# Patient Record
Sex: Male | Born: 1965 | Race: Asian | Hispanic: No | Marital: Married | Smoking: Never smoker
Health system: Southern US, Community
[De-identification: ages and names within clinical notes are randomized; demographics above are authoritative.]

## PROBLEM LIST (undated history)

## (undated) DIAGNOSIS — I639 Cerebral infarction, unspecified: Secondary | ICD-10-CM

## (undated) HISTORY — DX: Cerebral infarction, unspecified: I63.9

---

## 2017-06-27 ENCOUNTER — Emergency Department (HOSPITAL_COMMUNITY): Payer: BLUE CROSS/BLUE SHIELD

## 2017-06-27 ENCOUNTER — Inpatient Hospital Stay (HOSPITAL_COMMUNITY)
Admission: EM | Admit: 2017-06-27 | Discharge: 2017-07-05 | DRG: 065 | Disposition: A | Discharge status: Skilled Nursing Facility | Payer: BLUE CROSS/BLUE SHIELD | Attending: Internal Medicine | Admitting: Internal Medicine

## 2017-06-27 ENCOUNTER — Encounter (HOSPITAL_COMMUNITY): Payer: Self-pay | Admitting: Emergency Medicine

## 2017-06-27 ENCOUNTER — Observation Stay (HOSPITAL_COMMUNITY): Payer: BLUE CROSS/BLUE SHIELD

## 2017-06-27 DIAGNOSIS — R739 Hyperglycemia, unspecified: Secondary | ICD-10-CM

## 2017-06-27 DIAGNOSIS — M25511 Pain in right shoulder: Secondary | ICD-10-CM | POA: Diagnosis present

## 2017-06-27 DIAGNOSIS — E7439 Other disorders of intestinal carbohydrate absorption: Secondary | ICD-10-CM

## 2017-06-27 DIAGNOSIS — I10 Essential (primary) hypertension: Secondary | ICD-10-CM | POA: Diagnosis not present

## 2017-06-27 DIAGNOSIS — R402254 Coma scale, best verbal response, oriented, 24 hours or more after hospital admission: Secondary | ICD-10-CM | POA: Diagnosis present

## 2017-06-27 DIAGNOSIS — E876 Hypokalemia: Secondary | ICD-10-CM

## 2017-06-27 DIAGNOSIS — R471 Dysarthria and anarthria: Secondary | ICD-10-CM | POA: Diagnosis present

## 2017-06-27 DIAGNOSIS — R718 Other abnormality of red blood cells: Secondary | ICD-10-CM | POA: Diagnosis present

## 2017-06-27 DIAGNOSIS — E871 Hypo-osmolality and hyponatremia: Secondary | ICD-10-CM | POA: Diagnosis not present

## 2017-06-27 DIAGNOSIS — R531 Weakness: Secondary | ICD-10-CM | POA: Diagnosis not present

## 2017-06-27 DIAGNOSIS — E785 Hyperlipidemia, unspecified: Secondary | ICD-10-CM | POA: Diagnosis present

## 2017-06-27 DIAGNOSIS — Z23 Encounter for immunization: Secondary | ICD-10-CM

## 2017-06-27 DIAGNOSIS — I639 Cerebral infarction, unspecified: Secondary | ICD-10-CM | POA: Diagnosis not present

## 2017-06-27 DIAGNOSIS — R29709 NIHSS score 9: Secondary | ICD-10-CM | POA: Diagnosis present

## 2017-06-27 DIAGNOSIS — M62521 Muscle wasting and atrophy, not elsewhere classified, right upper arm: Secondary | ICD-10-CM | POA: Diagnosis present

## 2017-06-27 DIAGNOSIS — I6389 Other cerebral infarction: Secondary | ICD-10-CM | POA: Diagnosis not present

## 2017-06-27 DIAGNOSIS — M6258 Muscle wasting and atrophy, not elsewhere classified, other site: Secondary | ICD-10-CM | POA: Diagnosis present

## 2017-06-27 DIAGNOSIS — R1310 Dysphagia, unspecified: Secondary | ICD-10-CM | POA: Diagnosis present

## 2017-06-27 DIAGNOSIS — R402364 Coma scale, best motor response, obeys commands, 24 hours or more after hospital admission: Secondary | ICD-10-CM | POA: Diagnosis present

## 2017-06-27 DIAGNOSIS — R29810 Facial weakness: Secondary | ICD-10-CM | POA: Diagnosis present

## 2017-06-27 DIAGNOSIS — I6789 Other cerebrovascular disease: Secondary | ICD-10-CM | POA: Diagnosis present

## 2017-06-27 DIAGNOSIS — R29717 NIHSS score 17: Secondary | ICD-10-CM | POA: Diagnosis not present

## 2017-06-27 DIAGNOSIS — R402134 Coma scale, eyes open, to sound, 24 hours or more after hospital admission: Secondary | ICD-10-CM | POA: Diagnosis present

## 2017-06-27 DIAGNOSIS — R0989 Other specified symptoms and signs involving the circulatory and respiratory systems: Secondary | ICD-10-CM | POA: Diagnosis not present

## 2017-06-27 DIAGNOSIS — G8194 Hemiplegia, unspecified affecting left nondominant side: Secondary | ICD-10-CM | POA: Diagnosis present

## 2017-06-27 LAB — CBC
HEMATOCRIT: 41.1 % (ref 39.0–52.0)
HEMOGLOBIN: 13.3 g/dL (ref 13.0–17.0)
MCH: 21.7 pg — ABNORMAL LOW (ref 26.0–34.0)
MCHC: 32.4 g/dL (ref 30.0–36.0)
MCV: 67 fL — ABNORMAL LOW (ref 78.0–100.0)
Platelets: 197 10*3/uL (ref 150–400)
RBC: 6.13 MIL/uL — ABNORMAL HIGH (ref 4.22–5.81)
RDW: 15.4 % (ref 11.5–15.5)
WBC: 8.8 10*3/uL (ref 4.0–10.5)

## 2017-06-27 LAB — COMPREHENSIVE METABOLIC PANEL
ALBUMIN: 4.1 g/dL (ref 3.5–5.0)
ALT: 15 U/L — ABNORMAL LOW (ref 17–63)
ANION GAP: 10 (ref 5–15)
AST: 36 U/L (ref 15–41)
Alkaline Phosphatase: 110 U/L (ref 38–126)
BUN: 12 mg/dL (ref 6–20)
CHLORIDE: 99 mmol/L — AB (ref 101–111)
CO2: 22 mmol/L (ref 22–32)
Calcium: 8.9 mg/dL (ref 8.9–10.3)
Creatinine, Ser: 0.89 mg/dL (ref 0.61–1.24)
GFR calc Af Amer: >60 mL/min (ref >60)
GFR calc non Af Amer: >60 mL/min (ref >60)
GLUCOSE: 143 mg/dL — AB (ref 65–99)
POTASSIUM: 3.4 mmol/L — AB (ref 3.5–5.1)
SODIUM: 131 mmol/L — AB (ref 135–145)
Total Bilirubin: 0.6 mg/dL (ref 0.3–1.2)
Total Protein: 7.6 g/dL (ref 6.5–8.1)

## 2017-06-27 LAB — I-STAT CHEM 8, ED
BUN: 15 mg/dL (ref 6–20)
CALCIUM ION: 1.08 mmol/L — AB (ref 1.15–1.40)
CHLORIDE: 100 mmol/L — AB (ref 101–111)
Creatinine, Ser: 0.8 mg/dL (ref 0.61–1.24)
Glucose, Bld: 141 mg/dL — ABNORMAL HIGH (ref 65–99)
HEMATOCRIT: 44 % (ref 39.0–52.0)
Hemoglobin: 15 g/dL (ref 13.0–17.0)
POTASSIUM: 3.7 mmol/L (ref 3.5–5.1)
SODIUM: 138 mmol/L (ref 135–145)
TCO2: 27 mmol/L (ref 22–32)

## 2017-06-27 LAB — DIFFERENTIAL
BASOS ABS: 0.1 10*3/uL (ref 0.0–0.1)
Basophils Relative: 1 %
EOS PCT: 2 %
Eosinophils Absolute: 0.2 10*3/uL (ref 0.0–0.7)
LYMPHS ABS: 2.9 10*3/uL (ref 0.7–4.0)
Lymphocytes Relative: 33 %
MONO ABS: 0.4 10*3/uL (ref 0.1–1.0)
Monocytes Relative: 4 %
NEUTROS PCT: 60 %
Neutro Abs: 5.2 10*3/uL (ref 1.7–7.7)

## 2017-06-27 LAB — I-STAT TROPONIN, ED: Troponin i, poc: 0 ng/mL (ref 0.00–0.08)

## 2017-06-27 LAB — APTT: APTT: 26 s (ref 24–36)

## 2017-06-27 LAB — PROTIME-INR
INR: 1.01
Prothrombin Time: 13.2 seconds (ref 11.4–15.2)

## 2017-06-27 LAB — CBG MONITORING, ED: GLUCOSE-CAPILLARY: 133 mg/dL — AB (ref 65–99)

## 2017-06-27 MED ORDER — STROKE: EARLY STAGES OF RECOVERY BOOK
Freq: Once | Status: AC
  Administered 2017-06-27: 22:00:00
  Filled 2017-06-27: qty 1

## 2017-06-27 MED ORDER — ACETAMINOPHEN 160 MG/5ML PO SOLN: 650.0000 mg | ORAL | Status: DC | PRN

## 2017-06-27 MED ORDER — ONDANSETRON HCL 4 MG/2ML IJ SOLN
INTRAMUSCULAR | Status: AC
  Filled 2017-06-27: qty 2

## 2017-06-27 MED ORDER — ACETAMINOPHEN 325 MG PO TABS
650.0000 mg | ORAL_TABLET | ORAL | Status: DC | PRN
  Administered 2017-06-29 – 2017-07-04 (×6): 650 mg via ORAL
  Filled 2017-06-27 (×6): qty 2

## 2017-06-27 MED ORDER — ASPIRIN 300 MG RE SUPP: 300.0000 mg | Freq: Once | RECTAL | Status: DC

## 2017-06-27 MED ORDER — ENOXAPARIN SODIUM 40 MG/0.4ML ~~LOC~~ SOLN
40.0000 mg | SUBCUTANEOUS | Status: DC
  Administered 2017-06-27 – 2017-07-04 (×8): 40 mg via SUBCUTANEOUS
  Filled 2017-06-27 (×8): qty 0.4

## 2017-06-27 MED ORDER — ASPIRIN 300 MG RE SUPP
300.0000 mg | Freq: Every day | RECTAL | Status: DC
  Filled 2017-06-27: qty 1

## 2017-06-27 MED ORDER — HYDRALAZINE HCL 20 MG/ML IJ SOLN
10.0000 mg | Freq: Four times a day (QID) | INTRAMUSCULAR | Status: DC | PRN
  Administered 2017-06-28 – 2017-07-01 (×4): 10 mg via INTRAVENOUS
  Filled 2017-06-27 (×4): qty 1

## 2017-06-27 MED ORDER — SENNOSIDES-DOCUSATE SODIUM 8.6-50 MG PO TABS
1.0000 | ORAL_TABLET | Freq: Every evening | ORAL | Status: DC | PRN
  Administered 2017-07-01: 1 via ORAL
  Filled 2017-06-27: qty 1

## 2017-06-27 MED ORDER — CLOPIDOGREL BISULFATE 75 MG PO TABS
75.0000 mg | ORAL_TABLET | Freq: Every day | ORAL | Status: DC
  Administered 2017-06-28 – 2017-07-05 (×8): 75 mg via ORAL
  Filled 2017-06-27 (×8): qty 1

## 2017-06-27 MED ORDER — ASPIRIN 325 MG PO TABS
325.0000 mg | ORAL_TABLET | Freq: Every day | ORAL | Status: DC
  Administered 2017-06-28 – 2017-07-05 (×8): 325 mg via ORAL
  Filled 2017-06-27 (×8): qty 1

## 2017-06-27 MED ORDER — ACETAMINOPHEN 650 MG RE SUPP: 650.0000 mg | RECTAL | Status: DC | PRN

## 2017-06-27 MED ORDER — IOPAMIDOL (ISOVUE-370) INJECTION 76%
INTRAVENOUS | Status: AC
  Administered 2017-06-27: 90 mL
  Filled 2017-06-27: qty 100

## 2017-06-27 NOTE — ED Notes (Signed)
Report called , Pt will go to MRI then transport to 3W

## 2017-06-27 NOTE — ED Notes (Signed)
Patient transported to MRI 

## 2017-06-27 NOTE — ED Provider Notes (Signed)
Emergency Department Provider Note   I have reviewed the triage vital signs and the nursing notes.  Family member at bedside for translation  HISTORY  Chief Complaint Cerebrovascular Accident   HPI Tim Berger is a 51 y.o. male since to the emergency department by EMS as a code stroke.  Patient was last seen normal at 15:00 yesterday when he went to lay down for a nap.  He woke up 2 hours later with left-sided weakness and speech disturbance.  Patient continued to have symptoms through the evening and this morning when family called for help. [facility].  Paramedics report blood sugar greater than 130 and hypertension on scene.  They activated code stroke with the patient being LVO positive.   Level 5 caveat: Acute stroke with speech disturbance.    History reviewed. No pertinent past medical history.  Patient Active Problem List   Diagnosis Date Noted  . Suspected cerebrovascular accident (CVA) 06/27/2017  . Accelerated hypertension 06/27/2017  . Hyponatremia 06/27/2017  . Hypokalemia 06/27/2017  . Hyperglycemia 06/27/2017    Allergies Patient has no known allergies.  History reviewed. No pertinent family history.  Social History Social History   Tobacco Use  . Smoking status: Never Smoker  . Smokeless tobacco: Never Used  Substance Use Topics  . Alcohol use: No    Frequency: Never  . Drug use: No    Review of Systems  Level 5 caveat: Acute stroke with speech disturbance.   ____________________________________________   PHYSICAL EXAM:  VITAL SIGNS: Vitals:   06/27/17 1730 06/27/17 1745  BP: (!) 189/122 (!) 180/106  Pulse: 81 72  Resp: 18 19  Temp:    SpO2: 98% 99%     Constitutional: Alert. Well appearing and in no acute distress. Eyes: Conjunctivae are normal. PERRL Head: Atraumatic. Nose: No congestion/rhinnorhea. Mouth/Throat: Mucous membranes are moist.   Neck: No stridor.     Cardiovascular: Normal rate, regular rhythm. Good peripheral circulation. Grossly normal heart sounds.   Respiratory: Normal respiratory effort.  No retractions. Lungs CTAB. Gastrointestinal: Soft and nontender. No distention.  Musculoskeletal: No lower extremity tenderness nor edema. No gross deformities of extremities. Neurologic: Slurred speech and left face droop. Flaccid paralysis of the LUE with 4/5 strength in the LLE.  Skin:  Skin is warm, dry and intact. No rash noted.   ____________________________________________   LABS (all labs ordered are listed, but only abnormal results are displayed)  Labs Reviewed  CBC - Abnormal; Notable for the following components:      Result Value   RBC 6.13 (*)    MCV 67.0 (*)    MCH 21.7 (*)    All other components within normal limits  COMPREHENSIVE METABOLIC PANEL - Abnormal; Notable for the following components:   Sodium 131 (*)    Potassium 3.4 (*)    Chloride 99 (*)    Glucose, Bld 143 (*)    ALT 15 (*)    All other components within normal limits  CBG MONITORING, ED - Abnormal; Notable for the following components:   Glucose-Capillary 133 (*)    All other components within normal limits  I-STAT CHEM 8, ED - Abnormal; Notable for the following components:   Chloride 100 (*)    Glucose, Bld 141 (*)    Calcium, Ion 1.08 (*)    All other components within normal limits  PROTIME-INR  APTT  DIFFERENTIAL  HIV ANTIBODY (ROUTINE TESTING)  RAPID  URINE DRUG SCREEN, HOSP PERFORMED  HEMOGLOBIN A1C  LIPID PANEL  COMPREHENSIVE METABOLIC PANEL  MAGNESIUM  PHOSPHORUS  CBC WITH DIFFERENTIAL/PLATELET  I-STAT TROPONIN, ED   ____________________________________________  EKG   EKG Interpretation  Date/Time:  Wednesday June 27 2017 11:59:38 EST Ventricular Rate:  77 PR Interval:    QRS Duration: 104 QT Interval:  410 QTC Calculation: 464 R Axis:   11 Text Interpretation:  Sinus rhythm RSR' in V1 or V2, right VCD or RVH ST  elev, probable normal early repol pattern No STEMI.  Confirmed by Alona BeneLong, Damika Harmon [phone] on 06/27/2017 12:04:54 PM       ____________________________________________  RADIOLOGY  Ct Angio Head W Or Wo Contrast  Result Date: 06/27/2017 CLINICAL DATA:  51 year old male code stroke. Left side weakness and abnormal speech. Last seen normal 1300 hours yesterday. EXAM: CT ANGIOGRAPHY HEAD AND NECK CT PERFUSION BRAIN TECHNIQUE: Multidetector CT imaging of the head and neck was performed using the standard protocol during bolus administration of intravenous contrast. Multiplanar CT image reconstructions and MIPs were obtained to evaluate the vascular anatomy. Carotid stenosis measurements (when applicable) are obtained utilizing NASCET criteria, using the distal internal carotid diameter as the denominator. Multiphase CT imaging of the brain was performed following IV bolus contrast injection. Subsequent parametric perfusion maps were calculated using RAPID software. CONTRAST:  90mL ISOVUE-370 IOPAMIDOL (ISOVUE-370) INJECTION 76% COMPARISON:  CT head without contrast 1131 hours today. FINDINGS: CT Brain Perfusion Findings: CBF (<30%) Volume: 0 mL Perfusion (Tmax>6.0s) volume: 0 mL Mismatch Volume: Not applicablemL Infarction Location: Not applicable CTA NECK Skeleton: Intermittent poor dentition. No acute osseous abnormality identified. Mild paranasal sinus mucosal thickening, primarily in the ethmoids. Upper chest: Negative lung apices aside from mild dependent atelectasis. Negative visualized superior mediastinum. Other neck: Negative.  No cervical lymphadenopathy. Aortic arch: 3 vessel arch configuration. No significant arch atherosclerosis. Right carotid system: Unusual small innominate arterial branch off of the brachiocephalic artery (series 7, image 146), normal variant. Normal right CCA origin. Negative right CCA, right carotid bifurcation, and cervical right ICA. Left carotid system: Normal left CCA  origin. Negative left CCA, left carotid bifurcation, and cervical left ICA. Vertebral arteries: Normal proximal right subclavian artery. Normal right vertebral artery origin on series 10, image 151. Tortuous right V1 segment. Normal right vertebral artery to the skullbase. Mild circumferential soft plaque at the proximal left subclavian artery without stenosis. Evidence of soft plaque at the left vert T bull artery origin with up to moderate stenosis (series 10, image 149). The vertebral arteries are fairly codominant. The left is otherwise negative to the skullbase. CTA HEAD Posterior circulation: Tortuous distal vertebral arteries without atherosclerosis or stenosis. Normal left PICA origin. The right AICA appears dominant. The left V4 segment is somewhat diminutive beyond the left PICA. Patent vertebrobasilar junction without stenosis. Patent AICA origins. Patent basilar artery without stenosis. Patent SCA origins. Normal left PCA origin. Fetal right PCA origin. Left posterior communicating artery diminutive or absent. Bilateral PCA branches are within normal limits. Anterior circulation: Both ICA siphons are patent. No siphon plaque or stenosis identified. Normal ophthalmic artery origins. Normal right posterior communicating artery origin. Patent carotid termini. Normal MCA origins. The left ACA A1 is dominant and the right is diminutive or absent. The anterior communicating artery and bilateral ACA branches are within normal limits. The proximal left M1 segment is tortuous and perhaps mildly irregular, but there is no associated stenosis. The left MCA bifurcation is patent. Left MCA branches are within normal limits. The right  MCA M1 segment is patent without stenosis. The right MCA bifurcation is patent. The right MCA branches appear within normal limits. No right MCA branch stenosis or occlusion is identified. Venous sinuses: Patent. Anatomic variants: Mildly dominant distal right vertebral artery. Fetal  right PCA origin. Dominant left and diminutive or absent right ACA A1 segments. Small innominate arterial branch off of the proximal brachiocephalic artery in the superior mediastinum. Review of the MIP images confirms the above findings IMPRESSION: 1. Negative for emergent large vessel occlusion. No core infarct or penumbra detected by CT Perfusion. Right MCA branches appear within normal limits. This information was discussed by telephone with Dr. Caryl Pina on 06/27/2017 at noon. 2. Minimal extracranial atherosclerosis, primarily at the proximal left subclavian artery where there does appear to be mild or moderate stenosis at the Left Vertebral Artery origin. 3. Minimal intracranial atherosclerosis, primarily at the Left MCA M1 segment, with no intracranial stenosis. Electronically Signed   By: Odessa Fleming M.D.   On: 06/27/2017 12:13   Ct Angio Neck W Or Wo Contrast  Result Date: 06/27/2017 CLINICAL DATA:  51 year old male code stroke. Left side weakness and abnormal speech. Last seen normal 1300 hours yesterday. EXAM: CT ANGIOGRAPHY HEAD AND NECK CT PERFUSION BRAIN TECHNIQUE: Multidetector CT imaging of the head and neck was performed using the standard protocol during bolus administration of intravenous contrast. Multiplanar CT image reconstructions and MIPs were obtained to evaluate the vascular anatomy. Carotid stenosis measurements (when applicable) are obtained utilizing NASCET criteria, using the distal internal carotid diameter as the denominator. Multiphase CT imaging of the brain was performed following IV bolus contrast injection. Subsequent parametric perfusion maps were calculated using RAPID software. CONTRAST:  90mL ISOVUE-370 IOPAMIDOL (ISOVUE-370) INJECTION 76% COMPARISON:  CT head without contrast 1131 hours today. FINDINGS: CT Brain Perfusion Findings: CBF (<30%) Volume: 0 mL Perfusion (Tmax>6.0s) volume: 0 mL Mismatch Volume: Not applicablemL Infarction Location: Not applicable CTA NECK  Skeleton: Intermittent poor dentition. No acute osseous abnormality identified. Mild paranasal sinus mucosal thickening, primarily in the ethmoids. Upper chest: Negative lung apices aside from mild dependent atelectasis. Negative visualized superior mediastinum. Other neck: Negative.  No cervical lymphadenopathy. Aortic arch: 3 vessel arch configuration. No significant arch atherosclerosis. Right carotid system: Unusual small innominate arterial branch off of the brachiocephalic artery (series 7, image 146), normal variant. Normal right CCA origin. Negative right CCA, right carotid bifurcation, and cervical right ICA. Left carotid system: Normal left CCA origin. Negative left CCA, left carotid bifurcation, and cervical left ICA. Vertebral arteries: Normal proximal right subclavian artery. Normal right vertebral artery origin on series 10, image 151. Tortuous right V1 segment. Normal right vertebral artery to the skullbase. Mild circumferential soft plaque at the proximal left subclavian artery without stenosis. Evidence of soft plaque at the left vert T bull artery origin with up to moderate stenosis (series 10, image 149). The vertebral arteries are fairly codominant. The left is otherwise negative to the skullbase. CTA HEAD Posterior circulation: Tortuous distal vertebral arteries without atherosclerosis or stenosis. Normal left PICA origin. The right AICA appears dominant. The left V4 segment is somewhat diminutive beyond the left PICA. Patent vertebrobasilar junction without stenosis. Patent AICA origins. Patent basilar artery without stenosis. Patent SCA origins. Normal left PCA origin. Fetal right PCA origin. Left posterior communicating artery diminutive or absent. Bilateral PCA branches are within normal limits. Anterior circulation: Both ICA siphons are patent. No siphon plaque or stenosis identified. Normal ophthalmic artery origins. Normal right posterior communicating artery origin. Patent  carotid  termini. Normal MCA origins. The left ACA A1 is dominant and the right is diminutive or absent. The anterior communicating artery and bilateral ACA branches are within normal limits. The proximal left M1 segment is tortuous and perhaps mildly irregular, but there is no associated stenosis. The left MCA bifurcation is patent. Left MCA branches are within normal limits. The right MCA M1 segment is patent without stenosis. The right MCA bifurcation is patent. The right MCA branches appear within normal limits. No right MCA branch stenosis or occlusion is identified. Venous sinuses: Patent. Anatomic variants: Mildly dominant distal right vertebral artery. Fetal right PCA origin. Dominant left and diminutive or absent right ACA A1 segments. Small innominate arterial branch off of the proximal brachiocephalic artery in the superior mediastinum. Review of the MIP images confirms the above findings IMPRESSION: 1. Negative for emergent large vessel occlusion. No core infarct or penumbra detected by CT Perfusion. Right MCA branches appear within normal limits. This information was discussed by telephone with Dr. Caryl Pina on 06/27/2017 at noon. 2. Minimal extracranial atherosclerosis, primarily at the proximal left subclavian artery where there does appear to be mild or moderate stenosis at the Left Vertebral Artery origin. 3. Minimal intracranial atherosclerosis, primarily at the Left MCA M1 segment, with no intracranial stenosis. Electronically Signed   By: Odessa Fleming M.D.   On: 06/27/2017 12:13   Ct Cerebral Perfusion W Contrast  Result Date: 06/27/2017 CLINICAL DATA:  51 year old male code stroke. Left side weakness and abnormal speech. Last seen normal 1300 hours yesterday. EXAM: CT ANGIOGRAPHY HEAD AND NECK CT PERFUSION BRAIN TECHNIQUE: Multidetector CT imaging of the head and neck was performed using the standard protocol during bolus administration of intravenous contrast. Multiplanar CT image reconstructions and  MIPs were obtained to evaluate the vascular anatomy. Carotid stenosis measurements (when applicable) are obtained utilizing NASCET criteria, using the distal internal carotid diameter as the denominator. Multiphase CT imaging of the brain was performed following IV bolus contrast injection. Subsequent parametric perfusion maps were calculated using RAPID software. CONTRAST:  90mL ISOVUE-370 IOPAMIDOL (ISOVUE-370) INJECTION 76% COMPARISON:  CT head without contrast 1131 hours today. FINDINGS: CT Brain Perfusion Findings: CBF (<30%) Volume: 0 mL Perfusion (Tmax>6.0s) volume: 0 mL Mismatch Volume: Not applicablemL Infarction Location: Not applicable CTA NECK Skeleton: Intermittent poor dentition. No acute osseous abnormality identified. Mild paranasal sinus mucosal thickening, primarily in the ethmoids. Upper chest: Negative lung apices aside from mild dependent atelectasis. Negative visualized superior mediastinum. Other neck: Negative.  No cervical lymphadenopathy. Aortic arch: 3 vessel arch configuration. No significant arch atherosclerosis. Right carotid system: Unusual small innominate arterial branch off of the brachiocephalic artery (series 7, image 146), normal variant. Normal right CCA origin. Negative right CCA, right carotid bifurcation, and cervical right ICA. Left carotid system: Normal left CCA origin. Negative left CCA, left carotid bifurcation, and cervical left ICA. Vertebral arteries: Normal proximal right subclavian artery. Normal right vertebral artery origin on series 10, image 151. Tortuous right V1 segment. Normal right vertebral artery to the skullbase. Mild circumferential soft plaque at the proximal left subclavian artery without stenosis. Evidence of soft plaque at the left vert T bull artery origin with up to moderate stenosis (series 10, image 149). The vertebral arteries are fairly codominant. The left is otherwise negative to the skullbase. CTA HEAD Posterior circulation: Tortuous distal  vertebral arteries without atherosclerosis or stenosis. Normal left PICA origin. The right AICA appears dominant. The left V4 segment is somewhat diminutive beyond the left PICA. Patent  vertebrobasilar junction without stenosis. Patent AICA origins. Patent basilar artery without stenosis. Patent SCA origins. Normal left PCA origin. Fetal right PCA origin. Left posterior communicating artery diminutive or absent. Bilateral PCA branches are within normal limits. Anterior circulation: Both ICA siphons are patent. No siphon plaque or stenosis identified. Normal ophthalmic artery origins. Normal right posterior communicating artery origin. Patent carotid termini. Normal MCA origins. The left ACA A1 is dominant and the right is diminutive or absent. The anterior communicating artery and bilateral ACA branches are within normal limits. The proximal left M1 segment is tortuous and perhaps mildly irregular, but there is no associated stenosis. The left MCA bifurcation is patent. Left MCA branches are within normal limits. The right MCA M1 segment is patent without stenosis. The right MCA bifurcation is patent. The right MCA branches appear within normal limits. No right MCA branch stenosis or occlusion is identified. Venous sinuses: Patent. Anatomic variants: Mildly dominant distal right vertebral artery. Fetal right PCA origin. Dominant left and diminutive or absent right ACA A1 segments. Small innominate arterial branch off of the proximal brachiocephalic artery in the superior mediastinum. Review of the MIP images confirms the above findings IMPRESSION: 1. Negative for emergent large vessel occlusion. No core infarct or penumbra detected by CT Perfusion. Right MCA branches appear within normal limits. This information was discussed by telephone with Dr. Caryl Pina on 06/27/2017 at noon. 2. Minimal extracranial atherosclerosis, primarily at the proximal left subclavian artery where there does appear to be mild or  moderate stenosis at the Left Vertebral Artery origin. 3. Minimal intracranial atherosclerosis, primarily at the Left MCA M1 segment, with no intracranial stenosis. Electronically Signed   By: Odessa Fleming M.D.   On: 06/27/2017 12:13   Ct Head Code Stroke Wo Contrast  Result Date: 06/27/2017 CLINICAL DATA:  Code stroke. Left-sided weakness. Last seen normal yesterday. EXAM: CT HEAD WITHOUT CONTRAST TECHNIQUE: Contiguous axial images were obtained from the base of the skull through the vertex without intravenous contrast. COMPARISON:  None. FINDINGS: Brain: Normal appearance without evidence of malformation, atrophy, old or acute infarction, mass lesion, hemorrhage, hydrocephalus or extra-axial collection. Vascular: No abnormal vascular finding. Skull: Normal Sinuses/Orbits: Clear/normal Other: None ASPECTS ([place]berta Stroke Program Early CT Score) - Ganglionic level infarction (caudate, lentiform nuclei, internal capsule, insula, M1-M3 cortex): 7 - Supraganglionic infarction (M4-M6 cortex): 3 Total score (0-10 with 10 being normal): 10 IMPRESSION: 1. Normal head CT 2. ASPECTS is 10. These results were communicated to Dr. Otelia Limes at 11:36 amon 12/19/2018by text page via the [facility] messaging system. Electronically Signed   By: Paulina Fusi M.D.   On: 06/27/2017 11:38    ____________________________________________   PROCEDURES  Procedure(s) performed:   Procedures  None ____________________________________________   INITIAL IMPRESSION / ASSESSMENT AND PLAN / ED COURSE  Pertinent labs & imaging results that were available during my care of the patient were reviewed by me and considered in my medical decision making (see chart for details).  Patient presents to the emergency department as a code stroke.  Last seen normal 15:00 yesterday.  LVO positive.  Neurology present upon patient arrival and transported to CT. I evaluated the patient's airway and find agent.  He is clear for CT scan without  intubation at this time.   CT negative for LVO. No tPA given last known normal. Discussed with Dr. Otelia Limes with Neurology who recommends Hospitalist admission.   Discussed patient's case with Hospitalist to request admission. Patient and family (if present) updated with plan. Care  transferred to Hospitalist service.  I reviewed all nursing notes, vitals, pertinent old records, EKGs, labs, imaging (as available).  ____________________________________________  FINAL CLINICAL IMPRESSION(S) / ED DIAGNOSES  Final diagnoses:  Cerebral infarction, unspecified mechanism (HCC)  Stroke (HCC)     MEDICATIONS GIVEN DURING THIS VISIT:  Medications  ondansetron (ZOFRAN) 4 MG/2ML injection (not administered)  aspirin suppository 300 mg (300 mg Rectal Refused 06/27/17 1745)   stroke: mapping our early stages of recovery book (not administered)  acetaminophen (TYLENOL) tablet 650 mg (not administered)    Or  acetaminophen (TYLENOL) solution 650 mg (not administered)    Or  acetaminophen (TYLENOL) suppository 650 mg (not administered)  senna-docusate (Senokot-S) tablet 1 tablet (not administered)  enoxaparin (LOVENOX) injection 40 mg (not administered)  aspirin suppository 300 mg (not administered)    Or  aspirin tablet 325 mg (not administered)  hydrALAZINE (APRESOLINE) injection 10 mg (not administered)  iopamidol (ISOVUE-370) 76 % injection (90 mLs  Contrast Given 06/27/17 1130)     Note:  This document was prepared using Dragon voice recognition software and may include unintentional dictation errors.  Alona Bene, MD Emergency Medicine    Jarom Govan, Arlyss Repress, MD 06/27/17 [phone]

## 2017-06-27 NOTE — ED Notes (Addendum)
CBG taken upon arrival (11:29) was 133.

## 2017-06-27 NOTE — H&P (Addendum)
History and Physical    Tim Berger ZOX:[id number] DOB: [birth year] DOA: 06/27/2017  PCP: Patient, No Pcp Per   Patient coming from: Home  Chief Complaint: Left Sided Weakness and Fall  HPI: Tim Berger is a 51 y.o. male with no significant medical history as patient has never seen a doctor who presented from home with Left Sided Weakness and inability to stand. Patient does not speak [place] and only speaks Tim Berger so history was obtained from patient's friend who was present at bedside and translated. Patient was doing well until 3:00 pm yesterday when he had some Left sided weakness and fell. He was on the floor when friends got him up to the bed and massaged him to try to make him feel better. This AM he was still unable to stand with assistance so because of concern friends brought him to the hospital. Patient states that he thought he had some slurring of words but does not recall. Denied any N/V/D and denied any CP or SOB. [facility] was called to admit for Stroke Workup and Neurology was consulted for further evaluation.   ED Course: Was worked up had Head CT Stroke. Neurology was consulted and patient also underwent CTA Head and Neck.   Review of Systems: As per HPI otherwise 10 point review of systems negative.   PAST MEDICAL HISTORY History reviewed. No pertinent past medical history.  SURGICAL HISTORY History reviewed. No pertinent surgical history.  SOCIAL HISTORY  reports that  has never smoked. he has never used smokeless tobacco. He reports that he does not drink alcohol or use drugs.  ALLERGIES No Known Allergies  FAMILY HISTORY History reviewed with patient. No pertinent family history in patient's Mother and Father.  Prior to Admission medications   Not on File   Physical Exam: Vitals:   06/27/17 1330 06/27/17 1345 06/27/17 1400 06/27/17 1415  BP: (!) 187/122 (!) 196/111 (!) 198/129 (!) 196/120  Pulse: 70 65 80 71  Resp: 15 11 16 19   Temp:      TempSrc:        SpO2: 99% 99% 100% 100%   Constitutional: WN/WD Male in NAD and appears calm  Eyes: Lids and conjunctivae normal, sclerae anicteric  ENMT: External Ears, Nose appear normal. Grossly normal hearing. Mucous membranes are moist Has slight facial droop.  Neck: Appears normal, supple, no cervical masses, normal ROM, no appreciable thyromegaly Respiratory: Diminished to auscultation bilaterally, no wheezing, rales, rhonchi or crackles. Normal respiratory effort and patient is not tachypenic. No accessory muscle use.  Cardiovascular: RRR, no murmurs / rubs / gallops. S1 and S2 auscultated. No extremity edema.  Abdomen: Soft, non-tender, non-distended. No masses palpated. No appreciable hepatosplenomegaly. Bowel sounds positive x4.  GU: Deferred. Musculoskeletal: No clubbing / cyanosis of digits/nails. No joint deformity upper and lower extremities. Unable to move Left Arm as it is flaccid and Left Leg extremely weak.  Skin: No rashes, lesions, ulcers on a limited skin eval. No induration; Warm and dry.  Neurologic: Has significant Left sided Weakness in Left Upper Arm and Left Arm is Flaccid. Left Leg weak compared to to Right. Sensastion appeared intact. Had slight facial droop but CN 2-12 grossly intac.  Psychiatric: Normal judgment and insight. Alert and awake. ormal mood and appropriate affect.   Labs on Admission: I have personally reviewed following labs and imaging studies  CBC: Recent Labs  Lab 06/27/17 1124 06/27/17 1129  WBC 8.8  --   NEUTROABS 5.2  --   HGB 13.3  15.0  HCT 41.1 44.0  MCV 67.0*  --   PLT 197  --    Basic Metabolic Panel: Recent Labs  Lab 06/27/17 1124 06/27/17 1129  NA 131* 138  K 3.4* 3.7  CL 99* 100*  CO2 22  --   GLUCOSE 143* 141*  BUN 12 15  CREATININE 0.89 0.80  CALCIUM 8.9  --    GFR: CrCl cannot be calculated (Unknown ideal weight.). Liver Function Tests: Recent Labs  Lab 06/27/17 1124  AST 36  ALT 15*  ALKPHOS 110  BILITOT 0.6  PROT  7.6  ALBUMIN 4.1   No results for input(s): LIPASE, AMYLASE in the last 168 hours. No results for input(s): AMMONIA in the last 168 hours. Coagulation Profile: Recent Labs  Lab 06/27/17 1124  INR 1.01   Cardiac Enzymes: No results for input(s): CKTOTAL, CKMB, CKMBINDEX, TROPONINI in the last 168 hours. BNP (last 3 results) No results for input(s): PROBNP in the last 8760 hours. HbA1C: No results for input(s): HGBA1C in the last 72 hours. CBG: Recent Labs  Lab 06/27/17 1127  GLUCAP 133*   Lipid Profile: No results for input(s): CHOL, HDL, LDLCALC, TRIG, CHOLHDL, LDLDIRECT in the last 72 hours. Thyroid Function Tests: No results for input(s): TSH, T4TOTAL, FREET4, T3FREE, THYROIDAB in the last 72 hours. Anemia Panel: No results for input(s): VITAMINB12, FOLATE, FERRITIN, TIBC, IRON, RETICCTPCT in the last 72 hours. Urine analysis: No results found for: COLORURINE, APPEARANCEUR, LABSPEC, PHURINE, GLUCOSEU, HGBUR, BILIRUBINUR, KETONESUR, PROTEINUR, UROBILINOGEN, NITRITE, LEUKOCYTESUR Sepsis Labs: !!!!!!!!!!!!!!!!!!!!!!!!!!!!!!!!!!!!!!!!!!!! @LABRCNTIP (procalcitonin:4,lacticidven:4) )No results found for this or any previous visit (from the past 240 hour(s)).   Radiological Exams on Admission: Ct Angio Head W Or Wo Contrast  Result Date: 06/27/2017 CLINICAL DATA:  51 year old male code stroke. Left side weakness and abnormal speech. Last seen normal 1300 hours yesterday. EXAM: CT ANGIOGRAPHY HEAD AND NECK CT PERFUSION BRAIN TECHNIQUE: Multidetector CT imaging of the head and neck was performed using the standard protocol during bolus administration of intravenous contrast. Multiplanar CT image reconstructions and MIPs were obtained to evaluate the vascular anatomy. Carotid stenosis measurements (when applicable) are obtained utilizing NASCET criteria, using the distal internal carotid diameter as the denominator. Multiphase CT imaging of the brain was performed following IV bolus  contrast injection. Subsequent parametric perfusion maps were calculated using RAPID software. CONTRAST:  90mL ISOVUE-370 IOPAMIDOL (ISOVUE-370) INJECTION 76% COMPARISON:  CT head without contrast 1131 hours today. FINDINGS: CT Brain Perfusion Findings: CBF (<30%) Volume: 0 mL Perfusion (Tmax>6.0s) volume: 0 mL Mismatch Volume: Not applicablemL Infarction Location: Not applicable CTA NECK Skeleton: Intermittent poor dentition. No acute osseous abnormality identified. Mild paranasal sinus mucosal thickening, primarily in the ethmoids. Upper chest: Negative lung apices aside from mild dependent atelectasis. Negative visualized superior mediastinum. Other neck: Negative.  No cervical lymphadenopathy. Aortic arch: 3 vessel arch configuration. No significant arch atherosclerosis. Right carotid system: Unusual small innominate arterial branch off of the brachiocephalic artery (series 7, image 146), normal variant. Normal right CCA origin. Negative right CCA, right carotid bifurcation, and cervical right ICA. Left carotid system: Normal left CCA origin. Negative left CCA, left carotid bifurcation, and cervical left ICA. Vertebral arteries: Normal proximal right subclavian artery. Normal right vertebral artery origin on series 10, image 151. Tortuous right V1 segment. Normal right vertebral artery to the skullbase. Mild circumferential soft plaque at the proximal left subclavian artery without stenosis. Evidence of soft plaque at the left vert T bull artery origin with up to moderate stenosis (series 10,  image 149). The vertebral arteries are fairly codominant. The left is otherwise negative to the skullbase. CTA HEAD Posterior circulation: Tortuous distal vertebral arteries without atherosclerosis or stenosis. Normal left PICA origin. The right AICA appears dominant. The left V4 segment is somewhat diminutive beyond the left PICA. Patent vertebrobasilar junction without stenosis. Patent AICA origins. Patent basilar artery  without stenosis. Patent SCA origins. Normal left PCA origin. Fetal right PCA origin. Left posterior communicating artery diminutive or absent. Bilateral PCA branches are within normal limits. Anterior circulation: Both ICA siphons are patent. No siphon plaque or stenosis identified. Normal ophthalmic artery origins. Normal right posterior communicating artery origin. Patent carotid termini. Normal MCA origins. The left ACA A1 is dominant and the right is diminutive or absent. The anterior communicating artery and bilateral ACA branches are within normal limits. The proximal left M1 segment is tortuous and perhaps mildly irregular, but there is no associated stenosis. The left MCA bifurcation is patent. Left MCA branches are within normal limits. The right MCA M1 segment is patent without stenosis. The right MCA bifurcation is patent. The right MCA branches appear within normal limits. No right MCA branch stenosis or occlusion is identified. Venous sinuses: Patent. Anatomic variants: Mildly dominant distal right vertebral artery. Fetal right PCA origin. Dominant left and diminutive or absent right ACA A1 segments. Small innominate arterial branch off of the proximal brachiocephalic artery in the superior mediastinum. Review of the MIP images confirms the above findings IMPRESSION: 1. Negative for emergent large vessel occlusion. No core infarct or penumbra detected by CT Perfusion. Right MCA branches appear within normal limits. This information was discussed by telephone with Dr. Caryl Pina on 06/27/2017 at noon. 2. Minimal extracranial atherosclerosis, primarily at the proximal left subclavian artery where there does appear to be mild or moderate stenosis at the Left Vertebral Artery origin. 3. Minimal intracranial atherosclerosis, primarily at the Left MCA M1 segment, with no intracranial stenosis. Electronically Signed   By: Odessa Fleming M.D.   On: 06/27/2017 12:13   Ct Angio Neck W Or Wo Contrast  Result Date:  06/27/2017 CLINICAL DATA:  51 year old male code stroke. Left side weakness and abnormal speech. Last seen normal 1300 hours yesterday. EXAM: CT ANGIOGRAPHY HEAD AND NECK CT PERFUSION BRAIN TECHNIQUE: Multidetector CT imaging of the head and neck was performed using the standard protocol during bolus administration of intravenous contrast. Multiplanar CT image reconstructions and MIPs were obtained to evaluate the vascular anatomy. Carotid stenosis measurements (when applicable) are obtained utilizing NASCET criteria, using the distal internal carotid diameter as the denominator. Multiphase CT imaging of the brain was performed following IV bolus contrast injection. Subsequent parametric perfusion maps were calculated using RAPID software. CONTRAST:  90mL ISOVUE-370 IOPAMIDOL (ISOVUE-370) INJECTION 76% COMPARISON:  CT head without contrast 1131 hours today. FINDINGS: CT Brain Perfusion Findings: CBF (<30%) Volume: 0 mL Perfusion (Tmax>6.0s) volume: 0 mL Mismatch Volume: Not applicablemL Infarction Location: Not applicable CTA NECK Skeleton: Intermittent poor dentition. No acute osseous abnormality identified. Mild paranasal sinus mucosal thickening, primarily in the ethmoids. Upper chest: Negative lung apices aside from mild dependent atelectasis. Negative visualized superior mediastinum. Other neck: Negative.  No cervical lymphadenopathy. Aortic arch: 3 vessel arch configuration. No significant arch atherosclerosis. Right carotid system: Unusual small innominate arterial branch off of the brachiocephalic artery (series 7, image 146), normal variant. Normal right CCA origin. Negative right CCA, right carotid bifurcation, and cervical right ICA. Left carotid system: Normal left CCA origin. Negative left CCA, left carotid bifurcation, and  cervical left ICA. Vertebral arteries: Normal proximal right subclavian artery. Normal right vertebral artery origin on series 10, image 151. Tortuous right V1 segment. Normal right  vertebral artery to the skullbase. Mild circumferential soft plaque at the proximal left subclavian artery without stenosis. Evidence of soft plaque at the left vert T bull artery origin with up to moderate stenosis (series 10, image 149). The vertebral arteries are fairly codominant. The left is otherwise negative to the skullbase. CTA HEAD Posterior circulation: Tortuous distal vertebral arteries without atherosclerosis or stenosis. Normal left PICA origin. The right AICA appears dominant. The left V4 segment is somewhat diminutive beyond the left PICA. Patent vertebrobasilar junction without stenosis. Patent AICA origins. Patent basilar artery without stenosis. Patent SCA origins. Normal left PCA origin. Fetal right PCA origin. Left posterior communicating artery diminutive or absent. Bilateral PCA branches are within normal limits. Anterior circulation: Both ICA siphons are patent. No siphon plaque or stenosis identified. Normal ophthalmic artery origins. Normal right posterior communicating artery origin. Patent carotid termini. Normal MCA origins. The left ACA A1 is dominant and the right is diminutive or absent. The anterior communicating artery and bilateral ACA branches are within normal limits. The proximal left M1 segment is tortuous and perhaps mildly irregular, but there is no associated stenosis. The left MCA bifurcation is patent. Left MCA branches are within normal limits. The right MCA M1 segment is patent without stenosis. The right MCA bifurcation is patent. The right MCA branches appear within normal limits. No right MCA branch stenosis or occlusion is identified. Venous sinuses: Patent. Anatomic variants: Mildly dominant distal right vertebral artery. Fetal right PCA origin. Dominant left and diminutive or absent right ACA A1 segments. Small innominate arterial branch off of the proximal brachiocephalic artery in the superior mediastinum. Review of the MIP images confirms the above findings  IMPRESSION: 1. Negative for emergent large vessel occlusion. No core infarct or penumbra detected by CT Perfusion. Right MCA branches appear within normal limits. This information was discussed by telephone with Dr. Caryl Pina on 06/27/2017 at noon. 2. Minimal extracranial atherosclerosis, primarily at the proximal left subclavian artery where there does appear to be mild or moderate stenosis at the Left Vertebral Artery origin. 3. Minimal intracranial atherosclerosis, primarily at the Left MCA M1 segment, with no intracranial stenosis. Electronically Signed   By: Odessa Fleming M.D.   On: 06/27/2017 12:13   Ct Cerebral Perfusion W Contrast  Result Date: 06/27/2017 CLINICAL DATA:  51 year old male code stroke. Left side weakness and abnormal speech. Last seen normal 1300 hours yesterday. EXAM: CT ANGIOGRAPHY HEAD AND NECK CT PERFUSION BRAIN TECHNIQUE: Multidetector CT imaging of the head and neck was performed using the standard protocol during bolus administration of intravenous contrast. Multiplanar CT image reconstructions and MIPs were obtained to evaluate the vascular anatomy. Carotid stenosis measurements (when applicable) are obtained utilizing NASCET criteria, using the distal internal carotid diameter as the denominator. Multiphase CT imaging of the brain was performed following IV bolus contrast injection. Subsequent parametric perfusion maps were calculated using RAPID software. CONTRAST:  90mL ISOVUE-370 IOPAMIDOL (ISOVUE-370) INJECTION 76% COMPARISON:  CT head without contrast 1131 hours today. FINDINGS: CT Brain Perfusion Findings: CBF (<30%) Volume: 0 mL Perfusion (Tmax>6.0s) volume: 0 mL Mismatch Volume: Not applicablemL Infarction Location: Not applicable CTA NECK Skeleton: Intermittent poor dentition. No acute osseous abnormality identified. Mild paranasal sinus mucosal thickening, primarily in the ethmoids. Upper chest: Negative lung apices aside from mild dependent atelectasis. Negative  visualized superior mediastinum. Other neck: Negative.  No cervical lymphadenopathy. Aortic arch: 3 vessel arch configuration. No significant arch atherosclerosis. Right carotid system: Unusual small innominate arterial branch off of the brachiocephalic artery (series 7, image 146), normal variant. Normal right CCA origin. Negative right CCA, right carotid bifurcation, and cervical right ICA. Left carotid system: Normal left CCA origin. Negative left CCA, left carotid bifurcation, and cervical left ICA. Vertebral arteries: Normal proximal right subclavian artery. Normal right vertebral artery origin on series 10, image 151. Tortuous right V1 segment. Normal right vertebral artery to the skullbase. Mild circumferential soft plaque at the proximal left subclavian artery without stenosis. Evidence of soft plaque at the left vert T bull artery origin with up to moderate stenosis (series 10, image 149). The vertebral arteries are fairly codominant. The left is otherwise negative to the skullbase. CTA HEAD Posterior circulation: Tortuous distal vertebral arteries without atherosclerosis or stenosis. Normal left PICA origin. The right AICA appears dominant. The left V4 segment is somewhat diminutive beyond the left PICA. Patent vertebrobasilar junction without stenosis. Patent AICA origins. Patent basilar artery without stenosis. Patent SCA origins. Normal left PCA origin. Fetal right PCA origin. Left posterior communicating artery diminutive or absent. Bilateral PCA branches are within normal limits. Anterior circulation: Both ICA siphons are patent. No siphon plaque or stenosis identified. Normal ophthalmic artery origins. Normal right posterior communicating artery origin. Patent carotid termini. Normal MCA origins. The left ACA A1 is dominant and the right is diminutive or absent. The anterior communicating artery and bilateral ACA branches are within normal limits. The proximal left M1 segment is tortuous and perhaps  mildly irregular, but there is no associated stenosis. The left MCA bifurcation is patent. Left MCA branches are within normal limits. The right MCA M1 segment is patent without stenosis. The right MCA bifurcation is patent. The right MCA branches appear within normal limits. No right MCA branch stenosis or occlusion is identified. Venous sinuses: Patent. Anatomic variants: Mildly dominant distal right vertebral artery. Fetal right PCA origin. Dominant left and diminutive or absent right ACA A1 segments. Small innominate arterial branch off of the proximal brachiocephalic artery in the superior mediastinum. Review of the MIP images confirms the above findings IMPRESSION: 1. Negative for emergent large vessel occlusion. No core infarct or penumbra detected by CT Perfusion. Right MCA branches appear within normal limits. This information was discussed by telephone with Dr. Caryl PinaERIC LINDZEN on 06/27/2017 at noon. 2. Minimal extracranial atherosclerosis, primarily at the proximal left subclavian artery where there does appear to be mild or moderate stenosis at the Left Vertebral Artery origin. 3. Minimal intracranial atherosclerosis, primarily at the Left MCA M1 segment, with no intracranial stenosis. Electronically Signed   By: Odessa FlemingH  Hall M.D.   On: 06/27/2017 12:13   Ct Head Code Stroke Wo Contrast  Result Date: 06/27/2017 CLINICAL DATA:  Code stroke. Left-sided weakness. Last seen normal yesterday. EXAM: CT HEAD WITHOUT CONTRAST TECHNIQUE: Contiguous axial images were obtained from the base of the skull through the vertex without intravenous contrast. COMPARISON:  None. FINDINGS: Brain: Normal appearance without evidence of malformation, atrophy, old or acute infarction, mass lesion, hemorrhage, hydrocephalus or extra-axial collection. Vascular: No abnormal vascular finding. Skull: Normal Sinuses/Orbits: Clear/normal Other: None ASPECTS ([place] Stroke Program Early CT Score) - Ganglionic level infarction (caudate,  lentiform nuclei, internal capsule, insula, M1-M3 cortex): 7 - Supraganglionic infarction (M4-M6 cortex): 3 Total score (0-10 with 10 being normal): 10 IMPRESSION: 1. Normal head CT 2. ASPECTS is 10. These results were communicated to Dr. Otelia LimesLindzen at 11:36  amon 12/19/2018by text page via the [facility] messaging system. Electronically Signed   By: Paulina Fusi M.D.   On: 06/27/2017 11:38   EKG: Independently reviewed and showed Sinus Rhythm with a Rate of 77. No evidence of appreciable ST Elevation on my interpretation.   Assessment/Plan Active Problems:   Suspected cerebrovascular accident (CVA)   Accelerated hypertension   Hyponatremia   Hypokalemia   Hyperglycemia  Suspected CVA with Left Sided Weakness -Admit to NeuroStroke Tele Obs -Stroke Workup Initiated; No tPA given because outside of tPA window -CT Head Stroke showed Normal Head CT -CTA Head and Neck showed Negative for emergent large vessel occlusion. No core infarct or penumbra detected by CT Perfusion. Right MCA branches appear within normal limits. Minimal extracranial atherosclerosis, primarily at the proximal left subclavian artery where there does appear to be mild or moderate stenosis at the Left Vertebral Artery origin.  Minimal intracranial atherosclerosis, primarily at the Left MCA M1 segment, with no intracranial stenosis. -MRI Head w/o Contrast Ordered and Pending -MRA Head and Neck Ordered and Pending -Check Echocardiogram -Check Carotid Dopplers -Check HbA1c and Lipid Panel -C/w Daily ASA -Neurochecks per Protocol -PT/OT/SLP Evals; Patient failed beside Swallow Eval so will keep NPO -Neurology Consulted and Appreciate Further Recc's  Accelerated HTN -Allow for Permissive HTN given suspected Acute CVA and gradually reduce -Add Hydralazine for BP >220/110  Hyponatremia -Patient's Initial Na+ was 131 and repeat was 138 -Continue to Monitor and Repeat CMP in AM   Hypokalemia -Patient's initial K+ was 3.4 and repeat  was 3.7 -Continue to Monitor and Replete as Necessary -Repeat CMP in AM   Hyperglycemia  -Check HbA1c  DVT prophylaxis: Enoxaparin 40 mg sq q24h Code Status: FULL CODE Family Communication: Friends at beside Disposition Plan: Suspected SNF vs. Inpatient Rehab Consults called: Neurology Admission status: Obs Telemetry  Severity of Illness: The appropriate patient status for this patient is OBSERVATION. Observation status is judged to be reasonable and necessary in order to provide the required intensity of service to ensure the patient's safety. The patient's presenting symptoms, physical exam findings, and initial radiographic and laboratory data in the context of their medical condition is felt to place them at decreased risk for further clinical deterioration. Furthermore, it is anticipated that the patient will be medically stable for discharge from the hospital within 2 midnights of admission. The following factors support the patient status of observation.   " The patient's presenting symptoms include Left Sided weakness and Falls. " The physical exam findings include Flaccid Left Arm. " The initial radiographic and laboratory data are Reassuring thus far  [facility], D.O. [facility] Pager [phone]  If 7PM-7AM, please contact night-coverage [email or web address] Password [email or web address]  06/27/2017, 3:29 PM

## 2017-06-27 NOTE — Consult Note (Signed)
Referring Physician: Dr. Alfredia Ferguson    Chief Complaint: Left sided weakness  HPI: Tim Berger is an 51 y.o. male  with no significant PMHx brought into the emergency department via EMS as a code stroke.Patient was last seen normal at 15:00 yesterday when he went to lay down for a nap. He woke up at 1800 yesterday with left-sided weakness and slurred speech. Patient continued to have symptoms through the evening and this morning when family called for help. Family present in the ED is not aware of patient's PMHx.   LSN: 1500 on Tuesday tPA Given: No: Presented outside tPA window.   History reviewed. No pertinent past medical history.  History reviewed. No pertinent surgical history.  History reviewed. No pertinent family history. Social History:  reports that  has never smoked. he has never used smokeless tobacco. He reports that he does not drink alcohol or use drugs.  Allergies: No Known Allergies  Home Medications: None  ROS: Pertinent positives mentioned in HPI. No further history could be obtained from the patient.    Physical Examination: Blood pressure (!) 192/112, pulse 75, temperature 97.8 F (36.6 C), temperature source Temporal, resp. rate 15, SpO2 100 %.  Physical Exam  Constitutional: He appears well-developed and well-nourished.  Skin: Skin is warm and dry.   Neurological Examination Mental Status: Alert Not oriented to person, place, or time Dysarthric Comprehension intact. Speech fluent in [place].   Cranial Nerves: II: Visual fields intact bilaterally. PERRL III,IV, VI: ptosis not present, extra-ocular motions intact bilaterally  V,VII: Left-sided facial droop, facial light touch sensation decreased on the left VIII: hearing grossly normal bilaterally IX,X: Unable to assess at this time XI: Unable to assess at this time XII: Tongue deviates to the left Motor: Right :  Upper extremity   5/5 no ataxia                      Left:     Upper extremity   1/5,  flaccid with trace movement             Lower extremity   5/5                                      Lower extremity   4/5 with drift No atrophy noted Sensory: Pinprick and light touch decreased in the left upper extremity Deep Tendon Reflexes: 2+ in the right upper extremity, 3+ in the left upper extremity, patellar reflex 2+ in bilateral lower extremities, ankle reflex 0 bilaterally Plantars: Right: downgoing                                Left: Upgoing Cerebellar: Normal finger-to-nose (right upper extremity) Gait: Unable to assess  Results for orders placed or performed during the hospital encounter of 06/27/17 (from the past 48 hour(s))  Protime-INR     Status: None   Collection Time: 06/27/17 11:24 AM  Result Value Ref Range   Prothrombin Time 13.2 11.4 - 15.2 seconds   INR 1.01   APTT     Status: None   Collection Time: 06/27/17 11:24 AM  Result Value Ref Range   aPTT 26 24 - 36 seconds  CBC     Status: Abnormal   Collection Time: 06/27/17 11:24 AM  Result Value Ref Range   WBC 8.8 4.0 - 10.5  K/uL   RBC 6.13 (H) 4.22 - 5.81 MIL/uL   Hemoglobin 13.3 13.0 - 17.0 g/dL   HCT 41.1 39.0 - 52.0 %   MCV 67.0 (L) 78.0 - 100.0 fL   MCH 21.7 (L) 26.0 - 34.0 pg   MCHC 32.4 30.0 - 36.0 g/dL   RDW 15.4 11.5 - 15.5 %   Platelets 197 150 - 400 K/uL  Differential     Status: None   Collection Time: 06/27/17 11:24 AM  Result Value Ref Range   Neutrophils Relative % 60 %   Lymphocytes Relative 33 %   Monocytes Relative 4 %   Eosinophils Relative 2 %   Basophils Relative 1 %   Neutro Abs 5.2 1.7 - 7.7 K/uL   Lymphs Abs 2.9 0.7 - 4.0 K/uL   Monocytes Absolute 0.4 0.1 - 1.0 K/uL   Eosinophils Absolute 0.2 0.0 - 0.7 K/uL   Basophils Absolute 0.1 0.0 - 0.1 K/uL   RBC Morphology ELLIPTOCYTES     Comment: TARGET CELLS  Comprehensive metabolic panel     Status: Abnormal   Collection Time: 06/27/17 11:24 AM  Result Value Ref Range   Sodium 131 (L) 135 - 145 mmol/L   Potassium 3.4 (L) 3.5 -  5.1 mmol/L   Chloride 99 (L) 101 - 111 mmol/L   CO2 22 22 - 32 mmol/L   Glucose, Bld 143 (H) 65 - 99 mg/dL   BUN 12 6 - 20 mg/dL   Creatinine, Ser 0.89 0.61 - 1.24 mg/dL   Calcium 8.9 8.9 - 10.3 mg/dL   Total Protein 7.6 6.5 - 8.1 g/dL   Albumin 4.1 3.5 - 5.0 g/dL   AST 36 15 - 41 U/L   ALT 15 (L) 17 - 63 U/L   Alkaline Phosphatase 110 38 - 126 U/L   Total Bilirubin 0.6 0.3 - 1.2 mg/dL   GFR calc non Af Amer >60 >60 mL/min   GFR calc Af Amer >60 >60 mL/min    Comment: (NOTE) The eGFR has been calculated using the CKD EPI equation. This calculation has not been validated in all clinical situations. eGFR's persistently <60 mL/min signify possible Chronic Kidney Disease.    Anion gap 10 5 - 15  I-stat troponin, ED     Status: None   Collection Time: 06/27/17 11:27 AM  Result Value Ref Range   Troponin i, poc 0.00 0.00 - 0.08 ng/mL   Comment 3            Comment: Due to the release kinetics of cTnI, a negative result within the first hours of the onset of symptoms does not rule out myocardial infarction with certainty. If myocardial infarction is still suspected, repeat the test at appropriate intervals.   CBG monitoring, ED     Status: Abnormal   Collection Time: 06/27/17 11:27 AM  Result Value Ref Range   Glucose-Capillary 133 (H) 65 - 99 mg/dL  I-Stat Chem 8, ED     Status: Abnormal   Collection Time: 06/27/17 11:29 AM  Result Value Ref Range   Sodium 138 135 - 145 mmol/L   Potassium 3.7 3.5 - 5.1 mmol/L   Chloride 100 (L) 101 - 111 mmol/L   BUN 15 6 - 20 mg/dL   Creatinine, Ser 0.80 0.61 - 1.24 mg/dL   Glucose, Bld 141 (H) 65 - 99 mg/dL   Calcium, Ion 1.08 (L) 1.15 - 1.40 mmol/L   TCO2 27 22 - 32 mmol/L   Hemoglobin 15.0 13.0 -  17.0 g/dL   HCT 44.0 39.0 - 52.0 %   Ct Angio Head W Or Wo Contrast  Result Date: 06/27/2017 CLINICAL DATA:  51 year old male code stroke. Left side weakness and abnormal speech. Last seen normal 1300 hours yesterday. EXAM: CT ANGIOGRAPHY  HEAD AND NECK CT PERFUSION BRAIN TECHNIQUE: Multidetector CT imaging of the head and neck was performed using the standard protocol during bolus administration of intravenous contrast. Multiplanar CT image reconstructions and MIPs were obtained to evaluate the vascular anatomy. Carotid stenosis measurements (when applicable) are obtained utilizing NASCET criteria, using the distal internal carotid diameter as the denominator. Multiphase CT imaging of the brain was performed following IV bolus contrast injection. Subsequent parametric perfusion maps were calculated using RAPID software. CONTRAST:  38m ISOVUE-370 IOPAMIDOL (ISOVUE-370) INJECTION 76% COMPARISON:  CT head without contrast 1131 hours today. FINDINGS: CT Brain Perfusion Findings: CBF (<30%) Volume: 0 mL Perfusion (Tmax>6.0s) volume: 0 mL Mismatch Volume: Not applicablemL Infarction Location: Not applicable CTA NECK Skeleton: Intermittent poor dentition. No acute osseous abnormality identified. Mild paranasal sinus mucosal thickening, primarily in the ethmoids. Upper chest: Negative lung apices aside from mild dependent atelectasis. Negative visualized superior mediastinum. Other neck: Negative.  No cervical lymphadenopathy. Aortic arch: 3 vessel arch configuration. No significant arch atherosclerosis. Right carotid system: Unusual small innominate arterial branch off of the brachiocephalic artery (series 7, image 146), normal variant. Normal right CCA origin. Negative right CCA, right carotid bifurcation, and cervical right ICA. Left carotid system: Normal left CCA origin. Negative left CCA, left carotid bifurcation, and cervical left ICA. Vertebral arteries: Normal proximal right subclavian artery. Normal right vertebral artery origin on series 10, image 151. Tortuous right V1 segment. Normal right vertebral artery to the skullbase. Mild circumferential soft plaque at the proximal left subclavian artery without stenosis. Evidence of soft plaque at the  left vert T bull artery origin with up to moderate stenosis (series 10, image 149). The vertebral arteries are fairly codominant. The left is otherwise negative to the skullbase. CTA HEAD Posterior circulation: Tortuous distal vertebral arteries without atherosclerosis or stenosis. Normal left PICA origin. The right AICA appears dominant. The left V4 segment is somewhat diminutive beyond the left PICA. Patent vertebrobasilar junction without stenosis. Patent AICA origins. Patent basilar artery without stenosis. Patent SCA origins. Normal left PCA origin. Fetal right PCA origin. Left posterior communicating artery diminutive or absent. Bilateral PCA branches are within normal limits. Anterior circulation: Both ICA siphons are patent. No siphon plaque or stenosis identified. Normal ophthalmic artery origins. Normal right posterior communicating artery origin. Patent carotid termini. Normal MCA origins. The left ACA A1 is dominant and the right is diminutive or absent. The anterior communicating artery and bilateral ACA branches are within normal limits. The proximal left M1 segment is tortuous and perhaps mildly irregular, but there is no associated stenosis. The left MCA bifurcation is patent. Left MCA branches are within normal limits. The right MCA M1 segment is patent without stenosis. The right MCA bifurcation is patent. The right MCA branches appear within normal limits. No right MCA branch stenosis or occlusion is identified. Venous sinuses: Patent. Anatomic variants: Mildly dominant distal right vertebral artery. Fetal right PCA origin. Dominant left and diminutive or absent right ACA A1 segments. Small innominate arterial branch off of the proximal brachiocephalic artery in the superior mediastinum. Review of the MIP images confirms the above findings IMPRESSION: 1. Negative for emergent large vessel occlusion. No core infarct or penumbra detected by CT Perfusion. Right MCA branches appear within  normal  limits. This information was discussed by telephone with Dr. Kerney Elbe on 06/27/2017 at noon. 2. Minimal extracranial atherosclerosis, primarily at the proximal left subclavian artery where there does appear to be mild or moderate stenosis at the Left Vertebral Artery origin. 3. Minimal intracranial atherosclerosis, primarily at the Left MCA M1 segment, with no intracranial stenosis. Electronically Signed   By: Genevie Ann M.D.   On: 06/27/2017 12:13   Ct Angio Neck W Or Wo Contrast  Result Date: 06/27/2017 CLINICAL DATA:  51 year old male code stroke. Left side weakness and abnormal speech. Last seen normal 1300 hours yesterday. EXAM: CT ANGIOGRAPHY HEAD AND NECK CT PERFUSION BRAIN TECHNIQUE: Multidetector CT imaging of the head and neck was performed using the standard protocol during bolus administration of intravenous contrast. Multiplanar CT image reconstructions and MIPs were obtained to evaluate the vascular anatomy. Carotid stenosis measurements (when applicable) are obtained utilizing NASCET criteria, using the distal internal carotid diameter as the denominator. Multiphase CT imaging of the brain was performed following IV bolus contrast injection. Subsequent parametric perfusion maps were calculated using RAPID software. CONTRAST:  57m ISOVUE-370 IOPAMIDOL (ISOVUE-370) INJECTION 76% COMPARISON:  CT head without contrast 1131 hours today. FINDINGS: CT Brain Perfusion Findings: CBF (<30%) Volume: 0 mL Perfusion (Tmax>6.0s) volume: 0 mL Mismatch Volume: Not applicablemL Infarction Location: Not applicable CTA NECK Skeleton: Intermittent poor dentition. No acute osseous abnormality identified. Mild paranasal sinus mucosal thickening, primarily in the ethmoids. Upper chest: Negative lung apices aside from mild dependent atelectasis. Negative visualized superior mediastinum. Other neck: Negative.  No cervical lymphadenopathy. Aortic arch: 3 vessel arch configuration. No significant arch atherosclerosis.  Right carotid system: Unusual small innominate arterial branch off of the brachiocephalic artery (series 7, image 146), normal variant. Normal right CCA origin. Negative right CCA, right carotid bifurcation, and cervical right ICA. Left carotid system: Normal left CCA origin. Negative left CCA, left carotid bifurcation, and cervical left ICA. Vertebral arteries: Normal proximal right subclavian artery. Normal right vertebral artery origin on series 10, image 151. Tortuous right V1 segment. Normal right vertebral artery to the skullbase. Mild circumferential soft plaque at the proximal left subclavian artery without stenosis. Evidence of soft plaque at the left vert T bull artery origin with up to moderate stenosis (series 10, image 149). The vertebral arteries are fairly codominant. The left is otherwise negative to the skullbase. CTA HEAD Posterior circulation: Tortuous distal vertebral arteries without atherosclerosis or stenosis. Normal left PICA origin. The right AICA appears dominant. The left V4 segment is somewhat diminutive beyond the left PICA. Patent vertebrobasilar junction without stenosis. Patent AICA origins. Patent basilar artery without stenosis. Patent SCA origins. Normal left PCA origin. Fetal right PCA origin. Left posterior communicating artery diminutive or absent. Bilateral PCA branches are within normal limits. Anterior circulation: Both ICA siphons are patent. No siphon plaque or stenosis identified. Normal ophthalmic artery origins. Normal right posterior communicating artery origin. Patent carotid termini. Normal MCA origins. The left ACA A1 is dominant and the right is diminutive or absent. The anterior communicating artery and bilateral ACA branches are within normal limits. The proximal left M1 segment is tortuous and perhaps mildly irregular, but there is no associated stenosis. The left MCA bifurcation is patent. Left MCA branches are within normal limits. The right MCA M1 segment is  patent without stenosis. The right MCA bifurcation is patent. The right MCA branches appear within normal limits. No right MCA branch stenosis or occlusion is identified. Venous sinuses: Patent. Anatomic variants: Mildly dominant  distal right vertebral artery. Fetal right PCA origin. Dominant left and diminutive or absent right ACA A1 segments. Small innominate arterial branch off of the proximal brachiocephalic artery in the superior mediastinum. Review of the MIP images confirms the above findings IMPRESSION: 1. Negative for emergent large vessel occlusion. No core infarct or penumbra detected by CT Perfusion. Right MCA branches appear within normal limits. This information was discussed by telephone with Dr. Kerney Elbe on 06/27/2017 at noon. 2. Minimal extracranial atherosclerosis, primarily at the proximal left subclavian artery where there does appear to be mild or moderate stenosis at the Left Vertebral Artery origin. 3. Minimal intracranial atherosclerosis, primarily at the Left MCA M1 segment, with no intracranial stenosis. Electronically Signed   By: Genevie Ann M.D.   On: 06/27/2017 12:13   Ct Cerebral Perfusion W Contrast  Result Date: 06/27/2017 CLINICAL DATA:  51 year old male code stroke. Left side weakness and abnormal speech. Last seen normal 1300 hours yesterday. EXAM: CT ANGIOGRAPHY HEAD AND NECK CT PERFUSION BRAIN TECHNIQUE: Multidetector CT imaging of the head and neck was performed using the standard protocol during bolus administration of intravenous contrast. Multiplanar CT image reconstructions and MIPs were obtained to evaluate the vascular anatomy. Carotid stenosis measurements (when applicable) are obtained utilizing NASCET criteria, using the distal internal carotid diameter as the denominator. Multiphase CT imaging of the brain was performed following IV bolus contrast injection. Subsequent parametric perfusion maps were calculated using RAPID software. CONTRAST:  30m ISOVUE-370  IOPAMIDOL (ISOVUE-370) INJECTION 76% COMPARISON:  CT head without contrast 1131 hours today. FINDINGS: CT Brain Perfusion Findings: CBF (<30%) Volume: 0 mL Perfusion (Tmax>6.0s) volume: 0 mL Mismatch Volume: Not applicablemL Infarction Location: Not applicable CTA NECK Skeleton: Intermittent poor dentition. No acute osseous abnormality identified. Mild paranasal sinus mucosal thickening, primarily in the ethmoids. Upper chest: Negative lung apices aside from mild dependent atelectasis. Negative visualized superior mediastinum. Other neck: Negative.  No cervical lymphadenopathy. Aortic arch: 3 vessel arch configuration. No significant arch atherosclerosis. Right carotid system: Unusual small innominate arterial branch off of the brachiocephalic artery (series 7, image 146), normal variant. Normal right CCA origin. Negative right CCA, right carotid bifurcation, and cervical right ICA. Left carotid system: Normal left CCA origin. Negative left CCA, left carotid bifurcation, and cervical left ICA. Vertebral arteries: Normal proximal right subclavian artery. Normal right vertebral artery origin on series 10, image 151. Tortuous right V1 segment. Normal right vertebral artery to the skullbase. Mild circumferential soft plaque at the proximal left subclavian artery without stenosis. Evidence of soft plaque at the left vert T bull artery origin with up to moderate stenosis (series 10, image 149). The vertebral arteries are fairly codominant. The left is otherwise negative to the skullbase. CTA HEAD Posterior circulation: Tortuous distal vertebral arteries without atherosclerosis or stenosis. Normal left PICA origin. The right AICA appears dominant. The left V4 segment is somewhat diminutive beyond the left PICA. Patent vertebrobasilar junction without stenosis. Patent AICA origins. Patent basilar artery without stenosis. Patent SCA origins. Normal left PCA origin. Fetal right PCA origin. Left posterior communicating artery  diminutive or absent. Bilateral PCA branches are within normal limits. Anterior circulation: Both ICA siphons are patent. No siphon plaque or stenosis identified. Normal ophthalmic artery origins. Normal right posterior communicating artery origin. Patent carotid termini. Normal MCA origins. The left ACA A1 is dominant and the right is diminutive or absent. The anterior communicating artery and bilateral ACA branches are within normal limits. The proximal left M1 segment is tortuous and perhaps  mildly irregular, but there is no associated stenosis. The left MCA bifurcation is patent. Left MCA branches are within normal limits. The right MCA M1 segment is patent without stenosis. The right MCA bifurcation is patent. The right MCA branches appear within normal limits. No right MCA branch stenosis or occlusion is identified. Venous sinuses: Patent. Anatomic variants: Mildly dominant distal right vertebral artery. Fetal right PCA origin. Dominant left and diminutive or absent right ACA A1 segments. Small innominate arterial branch off of the proximal brachiocephalic artery in the superior mediastinum. Review of the MIP images confirms the above findings IMPRESSION: 1. Negative for emergent large vessel occlusion. No core infarct or penumbra detected by CT Perfusion. Right MCA branches appear within normal limits. This information was discussed by telephone with Dr. Kerney Elbe on 06/27/2017 at noon. 2. Minimal extracranial atherosclerosis, primarily at the proximal left subclavian artery where there does appear to be mild or moderate stenosis at the Left Vertebral Artery origin. 3. Minimal intracranial atherosclerosis, primarily at the Left MCA M1 segment, with no intracranial stenosis. Electronically Signed   By: Genevie Ann M.D.   On: 06/27/2017 12:13   Ct Head Code Stroke Wo Contrast  Result Date: 06/27/2017 CLINICAL DATA:  Code stroke. Left-sided weakness. Last seen normal yesterday. EXAM: CT HEAD WITHOUT CONTRAST  TECHNIQUE: Contiguous axial images were obtained from the base of the skull through the vertex without intravenous contrast. COMPARISON:  None. FINDINGS: Brain: Normal appearance without evidence of malformation, atrophy, old or acute infarction, mass lesion, hemorrhage, hydrocephalus or extra-axial collection. Vascular: No abnormal vascular finding. Skull: Normal Sinuses/Orbits: Clear/normal Other: None ASPECTS ([place] Stroke Program Early CT Score) - Ganglionic level infarction (caudate, lentiform nuclei, internal capsule, insula, M1-M3 cortex): 7 - Supraganglionic infarction (M4-M6 cortex): 3 Total score (0-10 with 10 being normal): 10 IMPRESSION: 1. Normal head CT 2. ASPECTS is 10. These results were communicated to Dr. Cheral Marker at 11:36 amon 12/19/2018by text page via the [facility] messaging system. Electronically Signed   By: Nelson Chimes M.D.   On: 06/27/2017 11:38    Assessment: 51 y.o. male 51 y.o.male with no significant past medical history presenting with dysarthria and left hemiparesis.  Symptoms started at 1800 yesterday.  He is out of the tPA time window.  CT head normal.  CTA head and neck and CT cerebral perfusion negative for large vessel occlusion or perfusion deficit. There is minimal extracranial atherosclerosis and mild or moderate stenosis at the left vertebral artery origin.   Stroke Risk Factors - None per chart  Plan: 1. HgbA1c, fasting lipid panel 2. MRI of the brain without contrast 3. PT consult, OT consult, Speech consult 4. Echocardiogram 5. Prophylactic therapy-Antiplatelet med: Aspirin - 600 mg loading dose rectally, followed by 325 mg daily maintenance dose 6. Risk factor modification 7. Telemetry monitoring 8. Frequent neuro checks 9. Start Lipitor 40 mg po qd. Obtain baseline CK level 10. Permissive HTN x 24 hours  Shela Leff, MD PGY3 - IMTS Pager [phone]  I have seen and examined the patient. I have discussed the assessment and plan with the resident.   _0  signed: Dr. Kerney Elbe  06/27/2017, 1:21 PM

## 2017-06-27 NOTE — ED Notes (Signed)
Pt used restroom at 13:00 with assistance (urinal).

## 2017-06-27 NOTE — Code Documentation (Signed)
Pt arrives via EMS with left arm weakness and left facial droop, pt LKW was 1500 06/26/17, symptoms were discovered 06/26/17 at 1800, PTAR came to home today for eval of a fall, then facial droop and arm weakness was discovered and [facility] was called, Code stroke was initiated and pt brought to ED, NIHSS 9, CT, CTA, and CTP was preformed, see stroke documentation for times, pt does not speak english thus family used for acute translation, bedside handoff given to [place] RN, outside window for TPA, not an endovascular candidate per MD, will admit for stroke workup

## 2017-06-27 NOTE — ED Notes (Signed)
Pt in MRI at this time, staff ask to call ED and this RN will transport to floor straight from MRI

## 2017-06-27 NOTE — ED Notes (Signed)
Manual BP taken at 12:00 was 192/112.

## 2017-06-27 NOTE — ED Triage Notes (Signed)
Pt here as a code stroke , lsn yesterday at 3pm , van positive , pt does not speak english , left side weakness

## 2017-06-27 NOTE — ED Notes (Signed)
B/p remains  elevated , MD made aware will just monitor for now

## 2017-06-28 ENCOUNTER — Inpatient Hospital Stay (HOSPITAL_COMMUNITY): Payer: BLUE CROSS/BLUE SHIELD

## 2017-06-28 ENCOUNTER — Observation Stay (HOSPITAL_COMMUNITY): Payer: BLUE CROSS/BLUE SHIELD

## 2017-06-28 ENCOUNTER — Other Ambulatory Visit: Payer: Self-pay

## 2017-06-28 ENCOUNTER — Other Ambulatory Visit: Payer: Self-pay | Admitting: Student

## 2017-06-28 DIAGNOSIS — R0989 Other specified symptoms and signs involving the circulatory and respiratory systems: Secondary | ICD-10-CM | POA: Diagnosis not present

## 2017-06-28 DIAGNOSIS — I639 Cerebral infarction, unspecified: Secondary | ICD-10-CM

## 2017-06-28 DIAGNOSIS — E871 Hypo-osmolality and hyponatremia: Secondary | ICD-10-CM | POA: Diagnosis present

## 2017-06-28 DIAGNOSIS — R718 Other abnormality of red blood cells: Secondary | ICD-10-CM | POA: Diagnosis present

## 2017-06-28 DIAGNOSIS — E785 Hyperlipidemia, unspecified: Secondary | ICD-10-CM | POA: Diagnosis present

## 2017-06-28 DIAGNOSIS — I6789 Other cerebrovascular disease: Secondary | ICD-10-CM | POA: Diagnosis present

## 2017-06-28 DIAGNOSIS — R402364 Coma scale, best motor response, obeys commands, 24 hours or more after hospital admission: Secondary | ICD-10-CM | POA: Diagnosis present

## 2017-06-28 DIAGNOSIS — E876 Hypokalemia: Secondary | ICD-10-CM | POA: Diagnosis present

## 2017-06-28 DIAGNOSIS — M25511 Pain in right shoulder: Secondary | ICD-10-CM | POA: Diagnosis present

## 2017-06-28 DIAGNOSIS — M6258 Muscle wasting and atrophy, not elsewhere classified, other site: Secondary | ICD-10-CM | POA: Diagnosis present

## 2017-06-28 DIAGNOSIS — R29810 Facial weakness: Secondary | ICD-10-CM | POA: Diagnosis present

## 2017-06-28 DIAGNOSIS — R29717 NIHSS score 17: Secondary | ICD-10-CM | POA: Diagnosis not present

## 2017-06-28 DIAGNOSIS — I10 Essential (primary) hypertension: Secondary | ICD-10-CM

## 2017-06-28 DIAGNOSIS — Z23 Encounter for immunization: Secondary | ICD-10-CM | POA: Diagnosis not present

## 2017-06-28 DIAGNOSIS — R531 Weakness: Secondary | ICD-10-CM | POA: Diagnosis present

## 2017-06-28 DIAGNOSIS — R402254 Coma scale, best verbal response, oriented, 24 hours or more after hospital admission: Secondary | ICD-10-CM | POA: Diagnosis present

## 2017-06-28 DIAGNOSIS — M62521 Muscle wasting and atrophy, not elsewhere classified, right upper arm: Secondary | ICD-10-CM | POA: Diagnosis present

## 2017-06-28 DIAGNOSIS — R402134 Coma scale, eyes open, to sound, 24 hours or more after hospital admission: Secondary | ICD-10-CM | POA: Diagnosis present

## 2017-06-28 DIAGNOSIS — I63511 Cerebral infarction due to unspecified occlusion or stenosis of right middle cerebral artery: Secondary | ICD-10-CM

## 2017-06-28 DIAGNOSIS — I6389 Other cerebral infarction: Secondary | ICD-10-CM | POA: Diagnosis present

## 2017-06-28 DIAGNOSIS — R471 Dysarthria and anarthria: Secondary | ICD-10-CM | POA: Diagnosis present

## 2017-06-28 DIAGNOSIS — R29709 NIHSS score 9: Secondary | ICD-10-CM | POA: Diagnosis present

## 2017-06-28 DIAGNOSIS — R739 Hyperglycemia, unspecified: Secondary | ICD-10-CM | POA: Diagnosis present

## 2017-06-28 DIAGNOSIS — R1310 Dysphagia, unspecified: Secondary | ICD-10-CM | POA: Diagnosis present

## 2017-06-28 DIAGNOSIS — E7439 Other disorders of intestinal carbohydrate absorption: Secondary | ICD-10-CM | POA: Diagnosis present

## 2017-06-28 DIAGNOSIS — G8194 Hemiplegia, unspecified affecting left nondominant side: Secondary | ICD-10-CM | POA: Diagnosis present

## 2017-06-28 LAB — COMPREHENSIVE METABOLIC PANEL
ALBUMIN: 4.2 g/dL (ref 3.5–5.0)
ALT: 11 U/L — AB (ref 17–63)
ANION GAP: 9 (ref 5–15)
AST: 32 U/L (ref 15–41)
Alkaline Phosphatase: 115 U/L (ref 38–126)
BUN: 16 mg/dL (ref 6–20)
CHLORIDE: 101 mmol/L (ref 101–111)
CO2: 27 mmol/L (ref 22–32)
Calcium: 8.9 mg/dL (ref 8.9–10.3)
Creatinine, Ser: 0.89 mg/dL (ref 0.61–1.24)
GFR calc non Af Amer: >60 mL/min (ref >60)
Glucose, Bld: 97 mg/dL (ref 65–99)
Potassium: 3.2 mmol/L — ABNORMAL LOW (ref 3.5–5.1)
SODIUM: 137 mmol/L (ref 135–145)
Total Bilirubin: 0.8 mg/dL (ref 0.3–1.2)
Total Protein: 7.8 g/dL (ref 6.5–8.1)

## 2017-06-28 LAB — LIPID PANEL
Cholesterol: 269 mg/dL — ABNORMAL HIGH (ref 0–200)
HDL: 58 mg/dL (ref >40)
LDL CALC: 182 mg/dL — AB (ref 0–99)
TRIGLYCERIDES: 143 mg/dL (ref <150)
Total CHOL/HDL Ratio: 4.6 RATIO
VLDL: 29 mg/dL (ref 0–40)

## 2017-06-28 LAB — CBC WITH DIFFERENTIAL/PLATELET
BASOS ABS: 0.1 10*3/uL (ref 0.0–0.1)
Basophils Relative: 1 %
Eosinophils Absolute: 0.2 10*3/uL (ref 0.0–0.7)
Eosinophils Relative: 2 %
HEMATOCRIT: 42.1 % (ref 39.0–52.0)
HEMOGLOBIN: 13.7 g/dL (ref 13.0–17.0)
LYMPHS PCT: 29 %
Lymphs Abs: 2.3 10*3/uL (ref 0.7–4.0)
MCH: 22 pg — ABNORMAL LOW (ref 26.0–34.0)
MCHC: 32.5 g/dL (ref 30.0–36.0)
MCV: 67.5 fL — ABNORMAL LOW (ref 78.0–100.0)
MONOS PCT: 6 %
Monocytes Absolute: 0.5 10*3/uL (ref 0.1–1.0)
Neutro Abs: 4.9 10*3/uL (ref 1.7–7.7)
Neutrophils Relative %: 62 %
Platelets: 214 10*3/uL (ref 150–400)
RBC: 6.24 MIL/uL — AB (ref 4.22–5.81)
RDW: 15.6 % — ABNORMAL HIGH (ref 11.5–15.5)
WBC: 8 10*3/uL (ref 4.0–10.5)

## 2017-06-28 LAB — HEMOGLOBIN A1C
Hgb A1c MFr Bld: 6 % — ABNORMAL HIGH (ref 4.8–5.6)
MEAN PLASMA GLUCOSE: 125.5 mg/dL

## 2017-06-28 LAB — PHOSPHORUS: PHOSPHORUS: 4.3 mg/dL (ref 2.5–4.6)

## 2017-06-28 LAB — HIV ANTIBODY (ROUTINE TESTING W REFLEX): HIV SCREEN 4TH GENERATION: NONREACTIVE

## 2017-06-28 LAB — MAGNESIUM: Magnesium: 2.2 mg/dL (ref 1.7–2.4)

## 2017-06-28 LAB — ECHOCARDIOGRAM COMPLETE

## 2017-06-28 MED ORDER — POTASSIUM CHLORIDE 10 MEQ/100ML IV SOLN
10.0000 meq | Freq: Once | INTRAVENOUS | Status: AC
  Administered 2017-06-28: 10 meq via INTRAVENOUS
  Filled 2017-06-28: qty 100

## 2017-06-28 MED ORDER — POTASSIUM CHLORIDE 10 MEQ/100ML IV SOLN
10.0000 meq | INTRAVENOUS | Status: AC
  Administered 2017-06-28 (×5): 10 meq via INTRAVENOUS
  Filled 2017-06-28 (×6): qty 100

## 2017-06-28 MED ORDER — INFLUENZA VAC SPLIT QUAD 0.5 ML IM SUSY
0.5000 mL | PREFILLED_SYRINGE | INTRAMUSCULAR | Status: AC
  Administered 2017-06-29: 0.5 mL via INTRAMUSCULAR
  Filled 2017-06-28: qty 0.5

## 2017-06-28 MED ORDER — ATORVASTATIN CALCIUM 80 MG PO TABS
80.0000 mg | ORAL_TABLET | Freq: Every day | ORAL | Status: DC
  Administered 2017-06-28 – 2017-07-05 (×8): 80 mg via ORAL
  Filled 2017-06-28 (×8): qty 1

## 2017-06-28 NOTE — Progress Notes (Signed)
  Echocardiogram 2D Echocardiogram has been performed.  Tim Berger T Cathey [place] 06/28/2017, 9:21 AM

## 2017-06-28 NOTE — Evaluation (Signed)
Speech Language Pathology Evaluation Patient Details Name: Tim Berger MRN: [id number] DOB: 10/05/1965 Today's Date: 06/28/2017 Time: [id number] SLP Time Calculation (min) (ACUTE ONLY): 10 min  Problem List:  Patient Active Problem List   Diagnosis Date Noted  . Suspected cerebrovascular accident (CVA) 06/27/2017  . Accelerated hypertension 06/27/2017  . Hyponatremia 06/27/2017  . Hypokalemia 06/27/2017  . Hyperglycemia 06/27/2017   Past Medical History: History reviewed. No pertinent past medical history. Past Surgical History: History reviewed. No pertinent surgical history. HPI:  Tim Berger a 51 y.o.malewithno significantmedical historyas patient has never seen a doctor who presented from home with Left Sided Weakness and inability to stand. Patient does not speak [place] and only speaks Tim Berger so history was obtained from patient's friend who was present at bedside and translated. Patient was doing well until 3:00 pm yesterday when he had some Left sided weakness and fell. He was on the floor when friends got him up to the bed and massaged him to try to make him feel better. This AM he was still unable to stand with assistance so because of concern friends brought him to the hospital. Patient states that he thought he had some slurring of words but does not recall. Denied any N/V/D and denied any CP or SOB. TRH was called to admit for Stroke Workup and Neurology was consulted for further evaluation. MRI revealed acute nonhemorrhagic RIGHT basal ganglia infarct and mild chronic small vessel ischemic disease.(Child Berger a 51 y.o.malewithno significantmedical histo)   Assessment / Plan / Recommendation Clinical Impression  Pt is NonEnglish speaking gentleman whose neice was present and requested to interprete for pt. Pt with moderate cognitive deficits in the areas of sustained attention, intellectual awareness and basic problem solving. Unable to assess memory d/t pt's  perseverative comments on right arm discoordination. Pt's neice reports that pt is oriented x 4 but uncertain if pt actually understands acute CVA. Pt's neice further reports that pt's speech and language use in native language is appropriate. Pt speaks primarily at the phrase level and would suspect that pt demonstrates some form of dysarthria given left facial weakness. Therefore would recommend ST to follow for ongoing assessment of speech-language abilities.     SLP Assessment  SLP Recommendation/Assessment: Patient needs continued Speech Lanaguage Pathology Services SLP Visit Diagnosis: Cognitive communication deficit (R41.841);Aphasia (R47.01);Dysarthria and anarthria (R47.1);Dysphagia, oral phase (R13.11)    Follow Up Recommendations  Skilled Nursing facility    Frequency and Duration min 2x/week  2 weeks      SLP Evaluation Cognition  Overall Cognitive Status: Difficult to assess Arousal/Alertness: Awake/alert Orientation Level: Oriented X4 Attention: Sustained Sustained Attention: Impaired Sustained Attention Impairment: Verbal basic;Functional basic Awareness: Impaired Awareness Impairment: Intellectual impairment;Emergent impairment Problem Solving: Impaired Safety/Judgment: Impaired       Comprehension  Auditory Comprehension Overall Auditory Comprehension: (Difficult to assess d/t language barrier) Yes/No Questions: Within Functional Limits(for very simple questions related to self) Commands: Within Functional Limits(simple 1 step directions related to self) Conversation: Simple Interfering Components: Attention;Anxiety EffectiveTechniques: Repetition Visual Recognition/Discrimination Discrimination: Not tested Reading Comprehension Reading Status: Not tested    Expression Expression Primary Mode of Expression: Verbal Verbal Expression Overall Verbal Expression: (difficult to assess d/t specificity of language) Initiation: Impaired Automatic Speech:  Name;Social Response;Day of week;Month of year;Counting Level of Generative/Spontaneous Verbalization: Phrase;Sentence Other Verbal Expression Comments: (Niece states she can understand pt today but not yesterday ) Written Expression Dominant Hand: Right Written Expression: Not tested   Oral / Motor  Oral Motor/Sensory Function Overall  Oral Motor/Sensory Function: Moderate impairment Facial ROM: Suspected CN VII (facial) dysfunction;Reduced left Facial Symmetry: Abnormal symmetry left;Suspected CN VII (facial) dysfunction Facial Strength: Reduced left;Suspected CN VII (facial) dysfunction Facial Sensation: Reduced left;Suspected CN V (Trigeminal) dysfunction Lingual ROM: Reduced left Lingual Symmetry: Abnormal symmetry left Lingual Strength: Reduced Motor Speech Overall Motor Speech: Impaired Respiration: Within functional limits Phonation: Normal Resonance: Within functional limits Articulation: Impaired(d/t left facial weakness) Level of Impairment: Phrase Intelligibility: Unable to assess (comment)(per neice, his speech is intelligible)   GO          Functional Assessment Tool Used: skilled clinical judgement Functional Limitations: Attention Attention Current Status (W0981(G9165): At least 40 percent but less than 60 percent impaired, limited or restricted Attention Goal Status (X9147(G9166): At least 20 percent but less than 40 percent impaired, limited or restricted         [name] 06/28/2017, 9:54 AM

## 2017-06-28 NOTE — Evaluation (Signed)
Clinical/Bedside Swallow Evaluation Patient Details  Name: Tim Berger MRN: [id number] Date of Birth: 10/06/1965  Today's Date: 06/28/2017 Time: SLP Start Time (ACUTE ONLY): [id number] SLP Stop Time (ACUTE ONLY): 0936 SLP Time Calculation (min) (ACUTE ONLY): 10 min  Past Medical History: History reviewed. No pertinent past medical history. Past Surgical History: History reviewed. No pertinent surgical history. HPI:  Tim Berger a 51 y.o.malewithno significantmedical historyas patient has never seen a doctor who presented from home with Left Sided Weakness and inability to stand. Patient does not speak [place] and only speaks [name] so history was obtained from patient's friend who was present at bedside and translated. Patient was doing well until 3:00 pm yesterday when he had some Left sided weakness and fell. He was on the floor when friends got him up to the bed and massaged him to try to make him feel better. This AM he was still unable to stand with assistance so because of concern friends brought him to the hospital. Patient states that he thought he had some slurring of words but does not recall. Denied any N/V/D and denied any CP or SOB. TRH was called to admit for Stroke Workup and Neurology was consulted for further evaluation. MRI revealed acute nonhemorrhagic RIGHT basal ganglia infarct and mild chronic small vessel ischemic disease.   Assessment / Plan / Recommendation Clinical Impression  Pt presents with mild to moderate oral phase dysphagia c/b increased oral residue in left buccal area and anterior spillage of thin liquids and food boluses (both puree and graham crackers). Pt with no overt s/s of aspiration when consuming thin liquids via cup or straw. Use of straw decreases left anterior spillage. Pt with difficulty performing lingual sweep to clear left buccal residue therefore recommend dysphagia 2 diet with thin liquids, medicine whole with thin liquids (may put into  applesauce if needed). Pt demonstrates RUE apraxia with Mod A needed to put cup to mouth which was further complicated by impulsivity. Skilled ST is required to address further diet advancement.  SLP Visit Diagnosis: Dysphagia, oral phase (R13.11)    Aspiration Risk  Moderate aspiration risk    Diet Recommendation Dysphagia 2 (Fine chop);Thin liquid   Liquid Administration via: Straw;Cup Medication Administration: Whole meds with liquid Supervision: Staff to assist with self feeding;Full supervision/cueing for compensatory strategies Compensations: Minimize environmental distractions;Slow rate;Small sips/bites;Lingual sweep for clearance of pocketing Postural Changes: Seated upright at 90 degrees    Other  Recommendations Oral Care Recommendations: Oral care BID   Follow up Recommendations Skilled Nursing facility(No assistance available at discharge)      Frequency and Duration min 2x/week  2 weeks       Prognosis Prognosis for Safe Diet Advancement: Fair Barriers to Reach Goals: Cognitive deficits;Language deficits;Severity of deficits      Swallow Study   General Date of Onset: 06/27/17 HPI: Tim Berger a 51 y.o.malewithno significantmedical historyas patient has never seen a doctor who presented from home with Left Sided Weakness and inability to stand. Patient does not speak [place] and only speaks [name] so history was obtained from patient's friend who was present at bedside and translated. Patient was doing well until 3:00 pm yesterday when he had some Left sided weakness and fell. He was on the floor when friends got him up to the bed and massaged him to try to make him feel better. This AM he was still unable to stand with assistance so because of concern friends brought him to the hospital. Patient states  that he thought he had some slurring of words but does not recall. Denied any N/V/D and denied any CP or SOB. TRH was called to admit for Stroke Workup and  Neurology was consulted for further evaluation. MRI revealed acute nonhemorrhagic RIGHT basal ganglia infarct and mild chronic small vessel ischemic disease. Type of Study: Bedside Swallow Evaluation Previous Swallow Assessment: none in chart Diet Prior to this Study: NPO Temperature Spikes Noted: No Respiratory Status: Room air History of Recent Intubation: No Behavior/Cognition: Alert;Cooperative;Pleasant mood;Confused;Impulsive Oral Cavity Assessment: Within Functional Limits Oral Care Completed by SLP: Yes Oral Cavity - Dentition: Adequate natural dentition Vision: Functional for self-feeding Self-Feeding Abilities: Needs assist;Needs set up(d/t apraxic RUE movements) Patient Positioning: Upright in bed Baseline Vocal Quality: Normal Volitional Cough: Strong Volitional Swallow: Able to elicit    Oral/Motor/Sensory Function Overall Oral Motor/Sensory Function: Moderate impairment Facial ROM: Suspected CN VII (facial) dysfunction;Reduced left Facial Symmetry: Abnormal symmetry left;Suspected CN VII (facial) dysfunction Facial Strength: Reduced left;Suspected CN VII (facial) dysfunction Facial Sensation: Reduced left;Suspected CN V (Trigeminal) dysfunction Lingual ROM: Reduced left Lingual Symmetry: Abnormal symmetry left Lingual Strength: Reduced   Ice Chips Ice chips: Within functional limits Presentation: Spoon   Thin Liquid Thin Liquid: Impaired Presentation: Cup;Straw;Spoon;Self Fed Oral Phase Impairments: Reduced labial seal;Reduced lingual movement/coordination Oral Phase Functional Implications: Left anterior spillage    Nectar Thick Nectar Thick Liquid: Not tested   Honey Thick Honey Thick Liquid: Not tested   Puree Puree: Impaired Presentation: Self Fed Oral Phase Impairments: Reduced labial seal;Reduced lingual movement/coordination Oral Phase Functional Implications: Left anterior spillage;Left lateral sulci pocketing   Solid   GO   Solid:  Impaired Presentation: Self Fed Oral Phase Impairments: Reduced labial seal;Reduced lingual movement/coordination Oral Phase Functional Implications: Left anterior spillage;Oral residue;Left lateral sulci pocketing    Functional Assessment Tool Used: skilled clinical judgement Functional Limitations: Attention Attention Current Status (W0981(G9165): At least 40 percent but less than 60 percent impaired, limited or restricted Attention Goal Status (X9147(G9166): At least 20 percent but less than 40 percent impaired, limited or restricted   Tim Berger 06/28/2017,10:06 AM

## 2017-06-28 NOTE — Progress Notes (Signed)
NEUROHOSPITALISTS STROKE TEAM - DAILY PROGRESS NOTE   SUBJECTIVE (INTERVAL HISTORY) Niece is at the bedside and able to translate information to patient. Patient is found laying in bed in NAD. Overall he feels his condition is unchanged. Voices no new complaints. No new/acute events reported overnight.  Niece states that patient works at a [employer] [place]  He has not had medical care in many years.  He takes no medications at home.  Has no surgical history.  Denies smoking alcohol or drugs.  OBJECTIVE Lab Results: CBC:  Recent Labs  Lab 06/27/17 1124 06/27/17 1129 06/28/17 0502  WBC 8.8  --  8.0  HGB 13.3 15.0 13.7  HCT 41.1 44.0 42.1  MCV 67.0*  --  67.5*  PLT 197  --  214   BMP: Recent Labs  Lab 06/27/17 1124 06/27/17 1129 06/28/17 0502  NA 131* 138 137  K 3.4* 3.7 3.2*  CL 99* 100* 101  CO2 22  --  27  GLUCOSE 143* 141* 97  BUN 12 15 16   CREATININE 0.89 0.80 0.89  CALCIUM 8.9  --  8.9  MG  --   --  2.2  PHOS  --   --  4.3   Liver Function Tests:  Recent Labs  Lab 06/27/17 1124 06/28/17 0502  AST 36 32  ALT 15* 11*  ALKPHOS 110 115  BILITOT 0.6 0.8  PROT 7.6 7.8  ALBUMIN 4.1 4.2   Coagulation Studies:  Recent Labs    06/27/17 1124  APTT 26  INR 1.01   PHYSICAL EXAM Temp:  [97.4 F (36.3 C)-98.3 F (36.8 C)] 98.3 F (36.8 C) (12/20 1400) Pulse Rate:  [68-85] 85 (12/20 1400) Resp:  [8-19] 18 (12/20 1400) BP: (144-189)/(92-127) 157/96 (12/20 1400) SpO2:  [94 %-100 %] 98 % (12/20 1400) General - Well nourished, well developed, in no apparent distress Respiratory - Lungs clear bilaterally. No wheezing. Cardiovascular - Regular rate and rhythm  Neurological Examination Mental Status: Alert Not oriented to person,place,or time Dysarthric Comprehension intact. Speech fluent in [place].   Cranial Nerves: II: Visual fields intact bilaterally.  PERRL III,IV, VI: ptosis not present, extra-ocular motions intact bilaterally  V,VII:Left-sided facial droop, facial light touch sensationdecreased on the left VIII: hearinggrosslynormal bilaterally IX,X:Unable to assess at this time ZO:XWRUEAXI:Unable to assess at this time VWU:JWJXBJXII:Tongue deviates to the left Motor: Right :Upper extremity 5/5 Left: Upper extremity 1/5,flaccid                  Distal, Proximal at shoulder 3/5                                 with trace movement Lower extremity 5/5 Lower extremity4/5 with drift, No atrophy                                                                       noted Sensory: Pinprick and light touchdecreased in the left upper extremity Deep Tendon Reflexes:2+in the right upper extremity, 3+ in the left upper extremity, patellar reflex 2+ in bilateral lower extremities, ankle reflex 0 bilaterally Plantars: Right: downgoingLeft: Upgoing Cerebellar: Normal finger-to-nose(right upper extremity) Gait:Unable to  assess  IMAGING: I have personally reviewed the radiological images below and agree with the radiology interpretations. Ct Angio Head and neck and perfusion W Or Wo Contrast Result Date: 06/27/2017 IMPRESSION: 1. Negative for emergent large vessel occlusion. No core infarct or penumbra detected by CT Perfusion. Right MCA branches appear within normal limits. 2. Minimal extracranial atherosclerosis, primarily at the proximal left subclavian artery where there does appear to be mild or moderate stenosis at the Left Vertebral Artery origin. 3. Minimal intracranial atherosclerosis, primarily at the Left MCA M1 segment, with no intracranial stenosis. Electronically Signed   By: Odessa FlemingH  Hall M.D.   On: 06/27/2017 12:13   Dg Chest 2 View Result Date: 06/27/2017 CLINICAL DATA:  51 year old male with a history of stroke EXAM: CHEST  2 VIEW COMPARISON:   None. FINDINGS: The heart size and mediastinal contours are within normal limits. Both lungs are clear. The visualized skeletal structures are unremarkable. IMPRESSION: Negative for acute cardiopulmonary disease. Electronically Signed   By: Gilmer MorJaime  Wagner D.O.   On: 06/27/2017 19:48   MRI/MRA Brain Wo Contrast Result Date: 06/27/2017  IMPRESSION: MRI HEAD: 1. Acute nonhemorrhagic RIGHT basal ganglia infarct. 2. Mild chronic small vessel ischemic disease. MRA HEAD: 1. Focal high-grade flow limiting stenosis proximal RIGHT M2 segment. No emergent large vessel occlusion. Acute findings discussed with and reconfirmed by Dr.AROOR, Neurology on 06/27/2017 at 8:40 pm. Electronically Signed   By: Awilda Metroourtnay  Bloomer M.D.   On: 06/27/2017 20:45   Ct Head Code Stroke Wo Contrast Result Date: 06/27/2017 IMPRESSION: 1. Normal head CT 2. ASPECTS is 10.   Echocardiogram:  Study Conclusions - Left ventricle: The cavity size was normal. There was moderate   focal basal hypertrophy of the septum with otherwise mild   concetric hypertrophy. Systolic function was vigorous. The   estimated ejection fraction was in the range of 65% to 70%. Wall   motion was normal; there were no regional wall motion   abnormalities. Doppler parameters are consistent with abnormal   left ventricular relaxation (grade 1 diastolic dysfunction).   Doppler parameters are consistent with high ventricular filling   pressure. - Aortic valve: Transvalvular velocity was within the normal range.   There was no stenosis. There was no regurgitation. - Ascending aorta: The ascending aorta was mildly dilated. - Mitral valve: Transvalvular velocity was within the normal range.   There was no evidence for stenosis. There was no regurgitation. - Left atrium: The atrium was moderately dilated. - Right ventricle: The cavity size was normal. Wall thickness was   normal. Systolic function was normal. - Atrial septum: A patent foramen ovale cannot be  excluded. - Tricuspid valve: There was no regurgitation.  B/L LE Duplex:                                                  PENDING TCD with bubbles:                                             PENDING    ASSESSMENT: Mr. Rudean CurtKut Nissan is a 51 y.o. male with no significant past medical history presenting with dysarthria and left hemiparesis.Symptoms started at 1800 yesterday.He is out of the tPA time window.CT head normal. CTA  head and neck and CT cerebral perfusion negative for large vessel occlusion or perfusion deficit. There is minimal extracranial atherosclerosis and mild or moderate stenosis at the left vertebral artery origin.  MRI reveals  Acute nonhemorrhagic RIGHT basal ganglia infarct  STROKE:  Suspected Etiology: Likely cardioembolic versus small vessel disease Resultant Symptoms: dysarthria and left hemiparesis Stroke Risk Factors: hyperlipidemia and hypertension  Outstanding Stroke Work-up Studies:     B/L LE Duplex:                                                       PENDING TCD with bubbles:                                                 PENDING Hypercoagulable Labs                                          PENDING  06/28/2017:   Niece at bedside and able to translate information to patient.  Neuro exam remains stable with moderate dysarthria and left hemiparesis noted.  Echocardiogram cannot rule out PFO.  TCD with bubble pending.  Hypercoagulable labs pending.  Patient will likely need CIR rehab at discharge.  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 On DAPT, continue for 3 months and then Plavix alone  DYSPHAGIA: Passed SLP swallow evaluation with dysphagia 2 diet Aspiration Precautions in progress  R/O AFIB: Will need 30-day cardiac event monitor at discharge  MEDICAL ISSUES: Management per medicine team Hypokalemia  HYPERTENSION: Stable, some elevated blood pressures noted overnight Permissive hypertension (OK if <220/120) for 24-48 hours post stroke and then gradually normalized within 5-7 days. Long term BP goal normotensive. May slowly start B/P medications after 48 hours, if needed Home Meds: NONE  HYPERLIPIDEMIA:    Component Value Date/Time   CHOL 269 (H) 06/28/2017 0502   TRIG 143 06/28/2017 0502   HDL 58 06/28/2017 0502   CHOLHDL 4.6 06/28/2017 0502   VLDL 29 06/28/2017 0502   LDLCALC 182 (H) 06/28/2017 0502  Home Meds:  NONE LDL  goal < 70 Started on  Lipitor 80 mg daily Continue statin at discharge PCP to monitor LFT's  R/O DIABETES: Lab Results  Component Value Date   HGBA1C 6.0 (H) 06/28/2017  HgbA1c goal < 7.0  Other Active Problems: Active Problems:   Suspected cerebrovascular accident (CVA)   Accelerated hypertension   Hyponatremia   Hypokalemia   Hyperglycemia   CVA (cerebral vascular accident) [facility])  Hospital day # 0 VTE prophylaxis: Lovenox  Diet : DIET DYS 2 Room service appropriate? Yes; Fluid consistency: Thin   FAMILY UPDATES: family at bedside  TEAM UPDATES: Edsel Petrin, DO     Prior Home Stroke  Medications:  No antithrombotic  Discharge Stroke Meds:  Please discharge patient on aspirin 325 mg daily and clopidogrel 75 mg daily   Disposition: Final discharge disposition not confirmed Therapy Recs:               CIR Home Equipment:         PENDING Follow Up:  Follow-up Information    Nilda Riggs, NP. Schedule an appointment as soon as possible for a visit in 6 week(s).    Specialty:  Family Medicine Contact information: [address] [place] [address] [phone]          Patient, No Pcp Per -PCP Follow up in 1-2 weeks   Case Management aware of need  Brita Romp Stroke Neurology Team 06/28/2017 3:06 PM  ATTENDING NOTE: I reviewed above note and agree with the assessment and plan. I have made any additions or clarifications directly to the above note. Pt was seen and examined.   51 year old male with no PMH admitted for left-sided weakness and slurred speech.  Found to have high blood pressure.  MRI showed right BG/CR moderate-sized infarct.  MRA showed right M2 cutoff, however CTA head and neck unremarkable.  CTP negative.  LE venous Doppler no DVT.  EF 65-70%.  TCD bubble study unsuccessful due to lack of the temporal window.  Hypercoagulable workup pending.  LDL 182 and A1c 6.0.  UDS pending.   Pt still has left hemiparesis. BP still high. Pt stroke felt to be due to intracranial stenosis with risk factor of HTN and HLD. However, his CBC showed small teardrop cells, not sure about the significance. Put on DAPT and high dose lipitor. Recommend 30 day cardiac event monitoring as outpt to rule out afib. PT/OT recommend CIR.    Marvel Plan, MD PhD Stroke Neurology 06/28/2017 10:19 PM   To contact Stroke Continuity provider, please refer to [email or web address]. After hours, contact General Neurology

## 2017-06-28 NOTE — Consult Note (Signed)
Physical Medicine and Rehabilitation Consult Reason for Consult: Left side weakness Referring Physician: [facility]   HPI: Tim Berger is a 51 y.o. right handed non-English-speaking speaks [place] male with unremarkable past medical history on no prescription medications. Per chart review patient lives with family and 3 small children. Works at a [employer]. One level home. Independent prior to admission. Presented 06/27/2017 with left-sided weakness/dysarthria and fall. Cranial CT scan negative. CT angiogram head and neck showed no large vessel occlusion. Patient did not receive TPA. 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 Echocardiogram with ejection fraction of 70% grade 1 diastolic dysfunction. Neurology consulted presently on aspirin and Plavix for CVA prophylaxis. Subcutaneous Lovenox for DVT prophylaxis. Dysphagia #2 thin liquid diet. Physical therapy evaluation completed with recommendations of physical medicine rehabilitation consult.   Review of Systems  Unable to perform ROS: Language   History reviewed. No pertinent past medical history. History reviewed. No pertinent surgical history. History reviewed. No pertinent family history. Social History:  reports that  has never smoked. he has never used smokeless tobacco. He reports that he does not drink alcohol or use drugs. Allergies: No Known Allergies No medications prior to admission.    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 maxAx2 for managing LE off of bed and for assisting trunk to upright   Transfers Overall transfer level: Needs assistance Equipment used: 2 person hand held assist(arms over therapist shoulders) Transfers: Stand Pivot Transfers, Sit to/from Stand Sit to Stand: +2 physical assistance, Mod assist Stand pivot transfers: Max assist, +2 physical assistance General transfer comment: maxAx2 for power up and steadying in standing, physical assist for lifting and moving of L LE to step to recliner, no control in descent into chair       ADL: ADL Overall ADL's : Needs assistance/impaired Eating/Feeding: Cueing for safety, Cueing for sequencing, Moderate assistance Eating/Feeding Details (indicate cue type and reason): difficulty getting food to move due to R shoulder limitations Grooming: Moderate assistance, Bed level Grooming Details (indicate cue type and reason): Able to put on deoderant after top removed; left deoderant bottle under R arm Upper Body Bathing: Moderate assistance, Bed level Lower Body Bathing: Maximal assistance, Sit to/from stand Upper Body Dressing : Maximal assistance Lower Body Dressing: Maximal assistance Toilet Transfer: +2 for physical assistance, Maximal assistance Toileting- Clothing Manipulation and Hygiene: Maximal assistance Functional mobility during ADLs: Maximal assistance, +2 for physical assistance  Cognition: Cognition Overall Cognitive Status: Difficult to assess Arousal/Alertness: Awake/alert Orientation Level: Oriented X4 Attention: Sustained Sustained Attention: Impaired Sustained  Attention Impairment: Verbal basic, Functional basic Awareness: Impaired Awareness Impairment: Intellectual impairment, Emergent impairment Problem Solving: Impaired Safety/Judgment: Impaired Cognition Arousal/Alertness: Awake/alert Behavior During Therapy: Restless Overall Cognitive Status: Difficult to assess General Comments: appears impulsive; decreased awareness of deficits; decreawed attention Difficult to assess due  to: Impaired communication(difficulty in interpretation using niece)  Blood pressure (!) 157/96, pulse 85, temperature 98.3 F (36.8 C), temperature source Oral, resp. rate 18, SpO2 98 %. Physical Exam  Vitals reviewed. Constitutional: He appears well-developed.  HENT:  Head: Normocephalic.  Eyes: EOM are normal.  Neck: Normal range of motion. Neck supple. No thyromegaly present.  Cardiovascular: Normal rate and regular rhythm.  Respiratory: Effort normal and breath sounds normal. No respiratory distress.  GI: Soft. He exhibits no distension.  Neurological: He is alert.  Patient sitting up in bed. Exam limited by language barrier. He did follow some simple demonstrated commands.  Skin: Skin is warm and dry.    Results for orders placed or performed during the hospital encounter of 06/27/17 (from the past 24 hour(s))  HIV antibody (Routine Testing)     Status: None   Collection Time: 06/27/17  9:14 PM  Result Value Ref Range   HIV Screen 4th Generation wRfx Non Reactive Non Reactive  Hemoglobin A1c     Status: Abnormal   Collection Time: 06/28/17  5:02 AM  Result Value Ref Range   Hgb A1c MFr Bld 6.0 (H) 4.8 - 5.6 %   Mean Plasma Glucose 125.5 mg/dL  Comprehensive metabolic panel     Status: Abnormal   Collection Time: 06/28/17  5:02 AM  Result Value Ref Range   Sodium 137 135 - 145 mmol/L   Potassium 3.2 (L) 3.5 - 5.1 mmol/L   Chloride 101 101 - 111 mmol/L   CO2 27 22 - 32 mmol/L   Glucose, Bld 97 65 - 99 mg/dL   BUN 16 6 - 20 mg/dL   Creatinine, Ser  4.090.89 0.61 - 1.24 mg/dL   Calcium 8.9 8.9 - 81.110.3 mg/dL   Total Protein 7.8 6.5 - 8.1 g/dL   Albumin 4.2 3.5 - 5.0 g/dL   AST 32 15 - 41 U/L   ALT 11 (L) 17 - 63 U/L   Alkaline Phosphatase 115 38 - 126 U/L   Total Bilirubin 0.8 0.3 - 1.2 mg/dL   GFR calc non Af Amer >60 >60 mL/min   GFR calc Af Amer >60 >60 mL/min   Anion gap 9 5 - 15  Magnesium     Status: None   Collection Time: 06/28/17  5:02 AM  Result Value Ref Range   Magnesium 2.2 1.7 - 2.4 mg/dL  Phosphorus     Status: None   Collection Time: 06/28/17  5:02 AM  Result Value Ref Range   Phosphorus 4.3 2.5 - 4.6 mg/dL  CBC with Differential/Platelet     Status: Abnormal   Collection Time: 06/28/17  5:02 AM  Result Value Ref Range   WBC 8.0 4.0 - 10.5 K/uL   RBC 6.24 (H) 4.22 - 5.81 MIL/uL   Hemoglobin 13.7 13.0 - 17.0 g/dL   HCT 91.442.1 78.239.0 - 95.652.0 %   MCV 67.5 (L) 78.0 - 100.0 fL   MCH 22.0 (L) 26.0 - 34.0 pg   MCHC 32.5 30.0 - 36.0 g/dL   RDW 21.315.6 (H) 08.611.5 - 57.815.5 %   Platelets 214 150 - 400 K/uL   Neutrophils Relative % 62 %   Lymphocytes Relative 29 %   Monocytes Relative 6 %   Eosinophils Relative 2 %   Basophils Relative 1 %   Neutro Abs 4.9 1.7 - 7.7 K/uL   Lymphs Abs 2.3 0.7 - 4.0 K/uL   Monocytes Absolute 0.5 0.1 - 1.0 K/uL   Eosinophils Absolute 0.2 0.0 -  0.7 K/uL   Basophils Absolute 0.1 0.0 - 0.1 K/uL   RBC Morphology TEARDROP CELLS   Lipid panel     Status: Abnormal   Collection Time: 06/28/17  5:02 AM  Result Value Ref Range   Cholesterol 269 (H) 0 - 200 mg/dL   Triglycerides 102 <725 mg/dL   HDL 58 >36 mg/dL   Total CHOL/HDL Ratio 4.6 RATIO   VLDL 29 0 - 40 mg/dL   LDL Cholesterol 644 (H) 0 - 99 mg/dL   Ct Angio Head W Or Wo Contrast  Result Date: 06/27/2017 CLINICAL DATA:  51 year old male code stroke. Left side weakness and abnormal speech. Last seen normal 1300 hours yesterday. EXAM: CT ANGIOGRAPHY HEAD AND NECK CT PERFUSION BRAIN TECHNIQUE: Multidetector CT imaging of the head and neck was  performed using the standard protocol during bolus administration of intravenous contrast. Multiplanar CT image reconstructions and MIPs were obtained to evaluate the vascular anatomy. Carotid stenosis measurements (when applicable) are obtained utilizing NASCET criteria, using the distal internal carotid diameter as the denominator. Multiphase CT imaging of the brain was performed following IV bolus contrast injection. Subsequent parametric perfusion maps were calculated using RAPID software. CONTRAST:  90mL ISOVUE-370 IOPAMIDOL (ISOVUE-370) INJECTION 76% COMPARISON:  CT head without contrast 1131 hours today. FINDINGS: CT Brain Perfusion Findings: CBF (<30%) Volume: 0 mL Perfusion (Tmax>6.0s) volume: 0 mL Mismatch Volume: Not applicablemL Infarction Location: Not applicable CTA NECK Skeleton: Intermittent poor dentition. No acute osseous abnormality identified. Mild paranasal sinus mucosal thickening, primarily in the ethmoids. Upper chest: Negative lung apices aside from mild dependent atelectasis. Negative visualized superior mediastinum. Other neck: Negative.  No cervical lymphadenopathy. Aortic arch: 3 vessel arch configuration. No significant arch atherosclerosis. Right carotid system: Unusual small innominate arterial branch off of the brachiocephalic artery (series 7, image 146), normal variant. Normal right CCA origin. Negative right CCA, right carotid bifurcation, and cervical right ICA. Left carotid system: Normal left CCA origin. Negative left CCA, left carotid bifurcation, and cervical left ICA. Vertebral arteries: Normal proximal right subclavian artery. Normal right vertebral artery origin on series 10, image 151. Tortuous right V1 segment. Normal right vertebral artery to the skullbase. Mild circumferential soft plaque at the proximal left subclavian artery without stenosis. Evidence of soft plaque at the left vert T bull artery origin with up to moderate stenosis (series 10, image 149). The  vertebral arteries are fairly codominant. The left is otherwise negative to the skullbase. CTA HEAD Posterior circulation: Tortuous distal vertebral arteries without atherosclerosis or stenosis. Normal left PICA origin. The right AICA appears dominant. The left V4 segment is somewhat diminutive beyond the left PICA. Patent vertebrobasilar junction without stenosis. Patent AICA origins. Patent basilar artery without stenosis. Patent SCA origins. Normal left PCA origin. Fetal right PCA origin. Left posterior communicating artery diminutive or absent. Bilateral PCA branches are within normal limits. Anterior circulation: Both ICA siphons are patent. No siphon plaque or stenosis identified. Normal ophthalmic artery origins. Normal right posterior communicating artery origin. Patent carotid termini. Normal MCA origins. The left ACA A1 is dominant and the right is diminutive or absent. The anterior communicating artery and bilateral ACA branches are within normal limits. The proximal left M1 segment is tortuous and perhaps mildly irregular, but there is no associated stenosis. The left MCA bifurcation is patent. Left MCA branches are within normal limits. The right MCA M1 segment is patent without stenosis. The right MCA bifurcation is patent. The right MCA branches appear within normal limits. No right MCA  branch stenosis or occlusion is identified. Venous sinuses: Patent. Anatomic variants: Mildly dominant distal right vertebral artery. Fetal right PCA origin. Dominant left and diminutive or absent right ACA A1 segments. Small innominate arterial branch off of the proximal brachiocephalic artery in the superior mediastinum. Review of the MIP images confirms the above findings IMPRESSION: 1. Negative for emergent large vessel occlusion. No core infarct or penumbra detected by CT Perfusion. Right MCA branches appear within normal limits. This information was discussed by telephone with Dr. Caryl Pina on 06/27/2017 at  noon. 2. Minimal extracranial atherosclerosis, primarily at the proximal left subclavian artery where there does appear to be mild or moderate stenosis at the Left Vertebral Artery origin. 3. Minimal intracranial atherosclerosis, primarily at the Left MCA M1 segment, with no intracranial stenosis. Electronically Signed   By: Odessa Fleming M.D.   On: 06/27/2017 12:13   Dg Chest 2 View  Result Date: 06/27/2017 CLINICAL DATA:  51 year old male with a history of stroke EXAM: CHEST  2 VIEW COMPARISON:  None. FINDINGS: The heart size and mediastinal contours are within normal limits. Both lungs are clear. The visualized skeletal structures are unremarkable. IMPRESSION: Negative for acute cardiopulmonary disease. Electronically Signed   By: Gilmer Mor D.O.   On: 06/27/2017 19:48   Ct Angio Neck W Or Wo Contrast  Result Date: 06/27/2017 CLINICAL DATA:  51 year old male code stroke. Left side weakness and abnormal speech. Last seen normal 1300 hours yesterday. EXAM: CT ANGIOGRAPHY HEAD AND NECK CT PERFUSION BRAIN TECHNIQUE: Multidetector CT imaging of the head and neck was performed using the standard protocol during bolus administration of intravenous contrast. Multiplanar CT image reconstructions and MIPs were obtained to evaluate the vascular anatomy. Carotid stenosis measurements (when applicable) are obtained utilizing NASCET criteria, using the distal internal carotid diameter as the denominator. Multiphase CT imaging of the brain was performed following IV bolus contrast injection. Subsequent parametric perfusion maps were calculated using RAPID software. CONTRAST:  90mL ISOVUE-370 IOPAMIDOL (ISOVUE-370) INJECTION 76% COMPARISON:  CT head without contrast 1131 hours today. FINDINGS: CT Brain Perfusion Findings: CBF (<30%) Volume: 0 mL Perfusion (Tmax>6.0s) volume: 0 mL Mismatch Volume: Not applicablemL Infarction Location: Not applicable CTA NECK Skeleton: Intermittent poor dentition. No acute osseous  abnormality identified. Mild paranasal sinus mucosal thickening, primarily in the ethmoids. Upper chest: Negative lung apices aside from mild dependent atelectasis. Negative visualized superior mediastinum. Other neck: Negative.  No cervical lymphadenopathy. Aortic arch: 3 vessel arch configuration. No significant arch atherosclerosis. Right carotid system: Unusual small innominate arterial branch off of the brachiocephalic artery (series 7, image 146), normal variant. Normal right CCA origin. Negative right CCA, right carotid bifurcation, and cervical right ICA. Left carotid system: Normal left CCA origin. Negative left CCA, left carotid bifurcation, and cervical left ICA. Vertebral arteries: Normal proximal right subclavian artery. Normal right vertebral artery origin on series 10, image 151. Tortuous right V1 segment. Normal right vertebral artery to the skullbase. Mild circumferential soft plaque at the proximal left subclavian artery without stenosis. Evidence of soft plaque at the left vert T bull artery origin with up to moderate stenosis (series 10, image 149). The vertebral arteries are fairly codominant. The left is otherwise negative to the skullbase. CTA HEAD Posterior circulation: Tortuous distal vertebral arteries without atherosclerosis or stenosis. Normal left PICA origin. The right AICA appears dominant. The left V4 segment is somewhat diminutive beyond the left PICA. Patent vertebrobasilar junction without stenosis. Patent AICA origins. Patent basilar artery without stenosis. Patent SCA origins. Normal  left PCA origin. Fetal right PCA origin. Left posterior communicating artery diminutive or absent. Bilateral PCA branches are within normal limits. Anterior circulation: Both ICA siphons are patent. No siphon plaque or stenosis identified. Normal ophthalmic artery origins. Normal right posterior communicating artery origin. Patent carotid termini. Normal MCA origins. The left ACA A1 is dominant and  the right is diminutive or absent. The anterior communicating artery and bilateral ACA branches are within normal limits. The proximal left M1 segment is tortuous and perhaps mildly irregular, but there is no associated stenosis. The left MCA bifurcation is patent. Left MCA branches are within normal limits. The right MCA M1 segment is patent without stenosis. The right MCA bifurcation is patent. The right MCA branches appear within normal limits. No right MCA branch stenosis or occlusion is identified. Venous sinuses: Patent. Anatomic variants: Mildly dominant distal right vertebral artery. Fetal right PCA origin. Dominant left and diminutive or absent right ACA A1 segments. Small innominate arterial branch off of the proximal brachiocephalic artery in the superior mediastinum. Review of the MIP images confirms the above findings IMPRESSION: 1. Negative for emergent large vessel occlusion. No core infarct or penumbra detected by CT Perfusion. Right MCA branches appear within normal limits. This information was discussed by telephone with Dr. Caryl Pina on 06/27/2017 at noon. 2. Minimal extracranial atherosclerosis, primarily at the proximal left subclavian artery where there does appear to be mild or moderate stenosis at the Left Vertebral Artery origin. 3. Minimal intracranial atherosclerosis, primarily at the Left MCA M1 segment, with no intracranial stenosis. Electronically Signed   By: Odessa Fleming M.D.   On: 06/27/2017 12:13   Mr Brain Wo Contrast  Result Date: 06/27/2017 CLINICAL DATA:  Acute onset LEFT-sided weakness and speech disturbance after nap yesterday. Follow-up code stroke. History of hypertension. EXAM: MRI HEAD WITHOUT CONTRAST MRA HEAD WITHOUT CONTRAST TECHNIQUE: Multiplanar, multiecho pulse sequences of the brain and surrounding structures were obtained without intravenous contrast. Angiographic images of the head were obtained using MRA technique without contrast. COMPARISON:  CT HEAD  June 27, 2017 at 1131 hours and CTA HEAD June 27, 2017 at 1148 hours FINDINGS: MRI HEAD FINDINGS BRAIN: Confluent reduced diffusion RIGHT basal ganglia measuring to 4 x 1.7 cm with low ADC values. No susceptibility artifact to suggest hemorrhage. 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 Ventricles and sulci are normal for patient's age. No midline shift, mass effect or masses. A few scattered subcentimeter supratentorial white matter FLAIR T2 hyperintensities compatible with mild chronic small vessel ischemic disease. No abnormal extra-axial fluid collections. VASCULAR: Normal major intracranial vascular flow voids present at skull base. SKULL AND UPPER CERVICAL SPINE: No abnormal sellar expansion. No suspicious calvarial bone marrow signal. Craniocervical junction maintained. SINUSES/ORBITS: The mastoid air-cells and included paranasal sinuses are well-aerated. The included ocular globes and orbital contents are non-suspicious. OTHER: None. MRA HEAD FINDINGS- mildly motion degraded examination. ANTERIOR CIRCULATION: Normal flow related enhancement of the included cervical, petrous, cavernous and supraclinoid internal carotid arteries. Patent anterior communicating artery. Proximal severe flow limiting stenosis RIGHT M2 segment and thready distal flow. Otherwise patent anterior and middle cerebral arteries, including distal segments. No large vessel occlusion, flow limiting stenosis, aneurysm. POSTERIOR CIRCULATION: Codominant vertebral artery's. Basilar artery is patent, with normal flow related enhancement of the main branch vessels. Patent posterior cerebral arteries. Fetal origin RIGHT posterior cerebral artery. No large vessel occlusion, flow limiting stenosis,  aneurysm. ANATOMIC VARIANTS: Aplastic RIGHT A1 segment. Source images and MIP images were reviewed. IMPRESSION: MRI HEAD:  1. Acute nonhemorrhagic RIGHT basal ganglia infarct. 2. Mild chronic small vessel ischemic disease. MRA  HEAD: 1. Focal high-grade flow limiting stenosis proximal RIGHT M2 segment. No emergent large vessel occlusion. Acute findings discussed with and reconfirmed by Dr.AROOR, Neurology on 06/27/2017 at 8:40 pm. Electronically Signed   By: Awilda Metro M.D.   On: 06/27/2017 20:45   Ct Cerebral Perfusion W Contrast  Result Date: 06/27/2017 CLINICAL DATA:  51 year old male code stroke. Left side weakness and abnormal speech. Last seen normal 1300 hours yesterday. EXAM: CT ANGIOGRAPHY HEAD AND NECK CT PERFUSION BRAIN TECHNIQUE: Multidetector CT imaging of the head and neck was performed using the standard protocol during bolus administration of intravenous contrast. Multiplanar CT image reconstructions and MIPs were obtained to evaluate the vascular anatomy. Carotid stenosis measurements (when applicable) are obtained utilizing NASCET criteria, using the distal internal carotid diameter as the denominator. Multiphase CT imaging of the brain was performed following IV bolus contrast injection. Subsequent parametric perfusion maps were calculated using RAPID software. CONTRAST:  90mL ISOVUE-370 IOPAMIDOL (ISOVUE-370) INJECTION 76% COMPARISON:  CT head without contrast 1131 hours today. FINDINGS: CT Brain Perfusion Findings: CBF (<30%) Volume: 0 mL Perfusion (Tmax>6.0s) volume: 0 mL Mismatch Volume: Not applicablemL Infarction Location: Not applicable CTA NECK Skeleton: Intermittent poor dentition. No acute osseous abnormality identified. Mild paranasal sinus mucosal thickening, primarily in the ethmoids. Upper chest: Negative lung apices aside from mild dependent atelectasis. Negative visualized superior mediastinum. Other neck: Negative.  No cervical lymphadenopathy. Aortic arch: 3 vessel arch configuration. No significant arch atherosclerosis. Right carotid system: Unusual small innominate arterial branch off of the brachiocephalic artery (series 7, image 146), normal variant. Normal right CCA origin. Negative  right CCA, right carotid bifurcation, and cervical right ICA. Left carotid system: Normal left CCA origin. Negative left CCA, left carotid bifurcation, and cervical left ICA. Vertebral arteries: Normal proximal right subclavian artery. Normal right vertebral artery origin on series 10, image 151. Tortuous right V1 segment. Normal right vertebral artery to the skullbase. Mild circumferential soft plaque at the proximal left subclavian artery without stenosis. Evidence of soft plaque at the left vert T bull artery origin with up to moderate stenosis (series 10, image 149). The vertebral arteries are fairly codominant. The left is otherwise negative to the skullbase. CTA HEAD Posterior circulation: Tortuous distal vertebral arteries without atherosclerosis or stenosis. Normal left PICA origin. The right AICA appears dominant. The left V4 segment is somewhat diminutive beyond the left PICA. Patent vertebrobasilar junction without stenosis. Patent AICA origins. Patent basilar artery without stenosis. Patent SCA origins. Normal left PCA origin. Fetal right PCA origin. Left posterior communicating artery diminutive or absent. Bilateral PCA branches are within normal limits. Anterior circulation: Both ICA siphons are patent. No siphon plaque or stenosis identified. Normal ophthalmic artery origins. Normal right posterior communicating artery origin. Patent carotid termini. Normal MCA origins. The left ACA A1 is dominant and the right is diminutive or absent. The anterior communicating artery and bilateral ACA branches are within normal limits. The proximal left M1 segment is tortuous and perhaps mildly irregular, but there is no associated stenosis. The left MCA bifurcation is patent. Left MCA branches are within normal limits. The right MCA M1 segment is patent without stenosis. The right MCA bifurcation is patent. The right MCA branches appear within normal limits. No right MCA branch stenosis or occlusion is identified.  Venous sinuses: Patent. Anatomic variants: Mildly dominant distal right vertebral artery. Fetal right PCA origin. Dominant left and diminutive or absent right  ACA A1 segments. Small innominate arterial branch off of the proximal brachiocephalic artery in the superior mediastinum. Review of the MIP images confirms the above findings IMPRESSION: 1. Negative for emergent large vessel occlusion. No core infarct or penumbra detected by CT Perfusion. Right MCA branches appear within normal limits. This information was discussed by telephone with Dr. Caryl PinaERIC LINDZEN on 06/27/2017 at noon. 2. Minimal extracranial atherosclerosis, primarily at the proximal left subclavian artery where there does appear to be mild or moderate stenosis at the Left Vertebral Artery origin. 3. Minimal intracranial atherosclerosis, primarily at the Left MCA M1 segment, with no intracranial stenosis. Electronically Signed   By: Odessa FlemingH  Hall M.D.   On: 06/27/2017 12:13   Mr Maxine GlennMra Head Wo Contrast  Result Date: 06/27/2017 CLINICAL DATA:  Acute onset LEFT-sided weakness and speech disturbance after nap yesterday. Follow-up code stroke. History of hypertension. EXAM: MRI HEAD WITHOUT CONTRAST MRA HEAD WITHOUT CONTRAST TECHNIQUE: Multiplanar, multiecho pulse sequences of the brain and surrounding structures were obtained without intravenous contrast. Angiographic images of the head were obtained using MRA technique without contrast. COMPARISON:  CT HEAD June 27, 2017 at 1131 hours and CTA HEAD June 27, 2017 at 1148 hours FINDINGS: MRI HEAD FINDINGS BRAIN: Confluent reduced diffusion RIGHT basal ganglia measuring to 4 x 1.7 cm with low ADC values. No susceptibility artifact to suggest hemorrhage. 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 Ventricles and sulci are normal for patient's age. No midline shift, mass effect or masses. A few scattered subcentimeter supratentorial white matter FLAIR T2 hyperintensities compatible with mild  chronic small vessel ischemic disease. No abnormal extra-axial fluid collections. VASCULAR: Normal major intracranial vascular flow voids present at skull base. SKULL AND UPPER CERVICAL SPINE: No abnormal sellar expansion. No suspicious calvarial bone marrow signal. Craniocervical junction maintained. SINUSES/ORBITS: The mastoid air-cells and included paranasal sinuses are well-aerated. The included ocular globes and orbital contents are non-suspicious. OTHER: None. MRA HEAD FINDINGS- mildly motion degraded examination. ANTERIOR CIRCULATION: Normal flow related enhancement of the included cervical, petrous, cavernous and supraclinoid internal carotid arteries. Patent anterior communicating artery. Proximal severe flow limiting stenosis RIGHT M2 segment and thready distal flow. Otherwise patent anterior and middle cerebral arteries, including distal segments. No large vessel occlusion, flow limiting stenosis, aneurysm. POSTERIOR CIRCULATION: Codominant vertebral artery's. Basilar artery is patent, with normal flow related enhancement of the main branch vessels. Patent posterior cerebral arteries. Fetal origin RIGHT posterior cerebral artery. No large vessel occlusion, flow limiting stenosis,  aneurysm. ANATOMIC VARIANTS: Aplastic RIGHT A1 segment. Source images and MIP images were reviewed. IMPRESSION: MRI HEAD: 1. Acute nonhemorrhagic RIGHT basal ganglia infarct. 2. Mild chronic small vessel ischemic disease. MRA HEAD: 1. Focal high-grade flow limiting stenosis proximal RIGHT M2 segment. No emergent large vessel occlusion. Acute findings discussed with and reconfirmed by Dr.AROOR, Neurology on 06/27/2017 at 8:40 pm. Electronically Signed   By: Awilda Metroourtnay  Bloomer M.D.   On: 06/27/2017 20:45   Ct Head Code Stroke Wo Contrast  Result Date: 06/27/2017 CLINICAL DATA:  Code stroke. Left-sided weakness. Last seen normal yesterday. EXAM: CT HEAD WITHOUT CONTRAST TECHNIQUE: Contiguous axial images were obtained from the  base of the skull through the vertex without intravenous contrast. COMPARISON:  None. FINDINGS: Brain: Normal appearance without evidence of malformation, atrophy, old or acute infarction, mass lesion, hemorrhage, hydrocephalus or extra-axial collection. Vascular: No abnormal vascular finding. Skull: Normal Sinuses/Orbits: Clear/normal Other: None ASPECTS (Alberta Stroke Program Early CT Score) - Ganglionic level infarction (caudate, lentiform nuclei, internal capsule, insula, M1-M3 cortex):  7 - Supraganglionic infarction (M4-M6 cortex): 3 Total score (0-10 with 10 being normal): 10 IMPRESSION: 1. Normal head CT 2. ASPECTS is 10. These results were communicated to Dr. Otelia Limes at 11:36 amon 12/19/2018by text page via the [facility] messaging system. Electronically Signed   By: Paulina Fusi M.D.   On: 06/27/2017 11:38    Assessment/Plan: Diagnosis: right basal ganglia infarct 1. Does the need for close, 24 hr/day medical supervision in concert with the patient's rehab needs make it unreasonable for this patient to be served in a less intensive setting? Yes 2. Co-Morbidities requiring supervision/potential complications:  3. Due to bladder management, bowel management, safety, skin/wound care, disease management, medication administration, pain management and patient education, does the patient require 24 hr/day rehab nursing? Yes 4. Does the patient require coordinated care of a physician, rehab nurse, PT (1-2 hrs/day, 5 days/week), OT (1-2 hrs/day, 5 days/week) and SLP (1-2 hrs/day, 5 days/week) to address physical and functional deficits in the context of the above medical diagnosis(es)? Yes Addressing deficits in the following areas: balance, endurance, locomotion, strength, transferring, bowel/bladder control, bathing, dressing, feeding, grooming, toileting, cognition and psychosocial support 5. Can the patient actively participate in an intensive therapy program of at least 3 hrs of therapy per day at least  5 days per week? Yes 6. The potential for patient to make measurable gains while on inpatient rehab is good 7. Anticipated functional outcomes upon discharge from inpatient rehab are supervision and min assist  with PT, supervision and min assist with OT, supervision and min assist with SLP. 8. Estimated rehab length of stay to reach the above functional goals is: 18-24 days 9. Anticipated D/C setting: Home 10. Anticipated post D/C treatments: HH therapy and Outpatient therapy 11. Overall Rehab/Functional Prognosis: excellent  RECOMMENDATIONS: This patient's condition is appropriate for continued rehabilitative care in the following setting: CIR Patient has agreed to participate in recommended program. Yes N/A Note that insurance prior authorization may be required for reimbursement for recommended care.  Comment: Rehab Admissions Coordinator to follow up.  Thanks,  Ranelle Oyster, MD, [place] [facility]    Mcarthur Rossetti Angiulli, PA-C 06/28/2017

## 2017-06-28 NOTE — Progress Notes (Signed)
Preliminary results by tech - Venous Duplex Completed. Negative for deep and superficial vein thrombosis in both legs.  Marilynne Halstedita Casten Floren, BS, RDMS, RVT

## 2017-06-28 NOTE — Evaluation (Signed)
Physical Therapy Evaluation Patient Details Name: Tim Berger MRN: [id number] DOB: 08/28/1965 Today's Date: 06/28/2017   History of Present Illness  51 y.o. male with no significant medical history as patient has never seen a doctor who presented from home with Left Sided Weakness and inability to stand. Patient does not speak [place] and only speaks [place] Jar[place] Presented with Left sided weakness, slurred speech and fell. Outside tPA time window.  CT head normal.  CTA head and neck and CT cerebral perfusion negative for large vessel occlusion or perfusion deficit. MRI revealed Acute nonhemorrhagic RIGHT basal ganglia infarct  Clinical Impression  Pt is impaired in his mobility by increased L sided weakness UE>LE, in addition to inability to lift R arm at the shoulder. Pt is currently maxAx2 for bed mobility and transfers. 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 Pt lives at his sister's home and has extensive family support to aid in care at eventual discharge. Pt could tolerate and would benefit from the intensive therapy offered by admission to CIR. PT will continue to follow acutely until discharge.     Follow Up Recommendations CIR    Equipment Recommendations  Other (comment)(to be determined at next venue)    Recommendations for Other Services Rehab consult     Precautions / Restrictions Precautions Precautions: Fall Restrictions Weight Bearing Restrictions: No      Mobility  Bed Mobility Overal bed mobility: Needs Assistance Bed Mobility: Rolling;Sidelying to Sit Rolling: Max assist Sidelying to sit: Max assist;+2 for physical assistance       General bed mobility comments: maxAx2 for rolling onto R side. maxAx2 for managing LE off of bed and for assisting trunk to upright    Transfers Overall transfer level: Needs assistance Equipment used: 2 person hand held assist(arms over therapist shoulders) Transfers: Stand  Pivot Transfers   Stand pivot transfers: Max assist;+2 physical assistance       General transfer comment: maxAx2 for power up and steadying in standing, physical assist for lifting and moving of L LE to step to recliner, no control in descent into chair   Modified Rankin (Stroke Patients Only) Modified Rankin (Stroke Patients Only) Pre-Morbid Rankin Score: No symptoms Modified Rankin: Severe disability     Balance Overall balance assessment: Needs assistance Sitting-balance support: Feet supported;Single extremity supported Sitting balance-Leahy Scale: Zero Sitting balance - Comments: requires assist against L lateral lean, with max verbal cuing able to briefly bring trunk to upright unable to maintain for greater than 5 seconds  Postural control: Left lateral lean   Standing balance-Leahy Scale: Zero                               Pertinent Vitals/Pain Pain Assessment: No/denies pain    Home Living Family/patient expects to be discharged to:: Private residence Living Arrangements: Other relatives(with 3 young children 578,6, 5 mo; husband) Available Help at Discharge: Available 24 hours/day Type of Home: House Home Access: Level entry     Home Layout: One level Home Equipment: None Additional Comments: 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[employer]; drove    Prior Function Level of Independence: Independent with assistive device(s)         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     Hand Dominance   Dominant Hand: Right    Extremity/Trunk Assessment   Upper Extremity Assessment Upper Extremity Assessment: Defer to OT evaluation(R shoulder sulcus sign)  Lower Extremity Assessment Lower Extremity Assessment: LLE deficits/detail LLE Deficits / Details: strength in hip, knee and ankle grossly assessed at 2+/5, ROM WFL LLE Sensation: decreased light touch LLE Coordination: decreased fine motor;decreased gross motor       Communication   Communication:  Prefers language other than English([place]agn[place] Estanislado SpireJarai used niece to interpret)  Cognition Arousal/Alertness: Awake/alert Behavior During Therapy: Restless Overall Cognitive Status: Difficult to assess                                        General Comments General comments (skin integrity, edema, etc.): Pt niece assisted in interpretation throughout session as interpreter with correct dialect unavailable, however niece distracted and without a whole lot of knowledge of prior function        Assessment/Plan    PT Assessment Patient needs continued PT services  PT Problem List Decreased strength;Decreased range of motion;Decreased activity tolerance;Decreased balance;Decreased mobility;Decreased coordination;Decreased cognition;Decreased knowledge of use of DME;Decreased safety awareness;Impaired sensation       PT Treatment Interventions DME instruction;Gait training;Functional mobility training;Therapeutic activities;Therapeutic exercise;Balance training;Neuromuscular re-education;Cognitive remediation;Patient/family education;Wheelchair mobility training    PT Goals (Current goals can be found in the Care Plan section)  Acute Rehab PT Goals Patient Stated Goal: none stated PT Goal Formulation: Patient unable to participate in goal setting Time For Goal Achievement: 07/12/17 Potential to Achieve Goals: Fair    Frequency Min 4X/week     Co-evaluation PT/OT/SLP Co-Evaluation/Treatment: Yes   PT goals addressed during session: Mobility/safety with mobility         AM-PAC PT "6 Clicks" Daily Activity  Outcome Measure Difficulty turning over in bed (including adjusting bedclothes, sheets and blankets)?: Unable Difficulty moving from lying on back to sitting on the side of the bed? : Unable Difficulty sitting down on and standing up from a chair with arms (e.g., wheelchair, bedside commode, etc,.)?: Unable Help needed moving to and from a bed to chair (including  a wheelchair)?: Total Help needed walking in hospital room?: Total Help needed climbing 3-5 steps with a railing? : Total 6 Click Score: 6    End of Session Equipment Utilized During Treatment: Gait belt Activity Tolerance: Patient tolerated treatment well Patient left: in chair;with call bell/phone within reach;with chair alarm set;with family/visitor present Nurse Communication: Mobility status;Other (comment)(need for Stedy to return to bed) PT Visit Diagnosis: Unsteadiness on feet (R26.81);Other abnormalities of gait and mobility (R26.89);Muscle weakness (generalized) (M62.81);History of falling (Z91.81);Difficulty in walking, not elsewhere classified (R26.2);Other symptoms and signs involving the nervous system (R29.898);Hemiplegia and hemiparesis Hemiplegia - Right/Left: Left Hemiplegia - dominant/non-dominant: Non-dominant Hemiplegia - caused by: Cerebral infarction    Time: [id number] PT Time Calculation (min) (ACUTE ONLY): 29 min   Charges:   PT Evaluation $PT Eval Moderate Complexity: 1 Mod     PT G Codes:   PT G-Codes **NOT FOR INPATIENT CLASS** Functional Assessment Tool Used: AM-PAC 6 Clicks Basic Mobility Functional Limitation: Mobility: Walking and moving around Mobility: Walking and Moving Around Current Status (V4098(G8978): 100 percent impaired, limited or restricted Mobility: Walking and Moving Around Goal Status (J1914(G8979): At least 60 percent but less than 80 percent impaired, limited or restricted    [name] PT, DPT Acute Rehabilitation  [phone] Pager [phone]    [name] 06/28/2017, 12:22 PM

## 2017-06-28 NOTE — Progress Notes (Addendum)
PROGRESS NOTE    Tim CurtKut Killion  ZOX:[id number] DOB: 03/24/1966 DOA: 06/27/2017 PCP: Patient, No Pcp Per   Chief Complaint  Patient presents with  . Cerebrovascular Accident    Brief Narrative:  HPI on 06/27/2017 by Dr. Marguerita Merlesmair Sheikh Tim Berger is a 51 y.o. male with no significant medical history as patient has never seen a doctor who presented from home with Left Sided Weakness and inability to stand. Patient does not speak [place] and only speaks [name] so history was obtained from patient's friend who was present at bedside and translated. Patient was doing well until 3:00 pm yesterday when he had some Left sided weakness and fell. He was on the floor when friends got him up to the bed and massaged him to try to make him feel better. This AM he was still unable to stand with assistance so because of concern friends brought him to the hospital. Patient states that he thought he had some slurring of words but does not recall. Denied any N/V/D and denied any CP or SOB. [facility] was called to admit for Stroke Workup and Neurology was consulted for further evaluation.   Assessment & Plan   Acute CVA -Patient presented with left-sided weakness -CT head unremarkable -CTA head and neck is negative for marked large vessel occlusion. -MRI of brain showed acute nonhemorrhagic right basal ganglia infarct. Mild chronic small vessel ischemic disease. -MRA head showed focal high-grade flow limiting stenosis proximal right M2 segment. No emergent large vessel occlusion. -Echocardiogram shows an EF of 65-70%, grade 1 diastolic dysfunction -Hemoglobin A1c 6, LDL 182 -Continue statin, Plavix, aspirin -Neurology consulted and appreciated. -PT/OT recommended CIR -Inpatient rehabilitation consulted  Right Upper extremity - limited ROM -Per physical therapy, suspect patient may have rotator cuff injury. MRI ordered.  Accelerated hypertension -Allowing for permissive hypertension given the acute  CVA  Hyponatremia -Resolved, continue to monitor BMP  Hypokalemia -Potassium 3.2 today, continue replace and monitor BMP   Hyperglycemia -Hemoglobin A1c 6 -Continue to monitor closely  DVT Prophylaxis  lovenox  Code Status: Full  Family Communication: Family at bedside  Disposition Plan: Admitted.   Consultants Neurology Inpatient Rehab  Procedures  Echocardiogram  Antibiotics   Anti-infectives (From admission, onward)   None      Subjective:   Tim Berger seen and examined today. Patient's niece as well as nurse used for translation. Complains of not being able to move his left side. Otherwise has no complaints of chest pain or shortness of breath.  Objective:   Vitals:   06/28/17 0553 06/28/17 0655 06/28/17 0935 06/28/17 1400  BP: (!) 170/114 (!) 160/92 (!) 144/97 (!) 157/96  Pulse:   83 85  Resp: 18  18 18   Temp: 98.3 F (36.8 C)  98 F (36.7 C) 98.3 F (36.8 C)  TempSrc: Oral  Oral Oral  SpO2: 100%  96% 98%    Intake/Output Summary (Last 24 hours) at 06/28/2017 1528 Last data filed at 06/28/2017 1300 Gross per 24 hour  Intake 660 ml  Output 250 ml  Net 410 ml   There were no vitals filed for this visit.  Exam  General: Well developed, well nourished, NAD, appears stated age  HEENT: NCAT, PERRLA, EOMI, Anicteic Sclera, mucous membranes moist.   Cardiovascular: S1 S2 auscultated, no rubs, murmurs or gallops. Regular rate and rhythm.  Respiratory: Clear to auscultation bilaterally with equal chest rise  Abdomen: Soft, nontender, nondistended, + bowel sounds  Extremities: warm dry without cyanosis clubbing or edema  Neuro: AAOx3, left sided weakness.   Psych: Appropraite   Data Reviewed: I have personally reviewed following labs and imaging studies  CBC: Recent Labs  Lab 06/27/17 1124 06/27/17 1129 06/28/17 0502  WBC 8.8  --  8.0  NEUTROABS 5.2  --  4.9  HGB 13.3 15.0 13.7  HCT 41.1 44.0 42.1  MCV 67.0*  --  67.5*  PLT 197  --   214   Basic Metabolic Panel: Recent Labs  Lab 06/27/17 1124 06/27/17 1129 06/28/17 0502  NA 131* 138 137  K 3.4* 3.7 3.2*  CL 99* 100* 101  CO2 22  --  27  GLUCOSE 143* 141* 97  BUN 12 15 16   CREATININE 0.89 0.80 0.89  CALCIUM 8.9  --  8.9  MG  --   --  2.2  PHOS  --   --  4.3   GFR: CrCl cannot be calculated (Unknown ideal weight.). Liver Function Tests: Recent Labs  Lab 06/27/17 1124 06/28/17 0502  AST 36 32  ALT 15* 11*  ALKPHOS 110 115  BILITOT 0.6 0.8  PROT 7.6 7.8  ALBUMIN 4.1 4.2   No results for input(s): LIPASE, AMYLASE in the last 168 hours. No results for input(s): AMMONIA in the last 168 hours. Coagulation Profile: Recent Labs  Lab 06/27/17 1124  INR 1.01   Cardiac Enzymes: No results for input(s): CKTOTAL, CKMB, CKMBINDEX, TROPONINI in the last 168 hours. BNP (last 3 results) No results for input(s): PROBNP in the last 8760 hours. HbA1C: Recent Labs    06/28/17 0502  HGBA1C 6.0*   CBG: Recent Labs  Lab 06/27/17 1127  GLUCAP 133*   Lipid Profile: Recent Labs    06/28/17 0502  CHOL 269*  HDL 58  LDLCALC 182*  TRIG 143  CHOLHDL 4.6   Thyroid Function Tests: No results for input(s): TSH, T4TOTAL, FREET4, T3FREE, THYROIDAB in the last 72 hours. Anemia Panel: No results for input(s): VITAMINB12, FOLATE, FERRITIN, TIBC, IRON, RETICCTPCT in the last 72 hours. Urine analysis: No results found for: COLORURINE, APPEARANCEUR, LABSPEC, PHURINE, GLUCOSEU, HGBUR, BILIRUBINUR, KETONESUR, PROTEINUR, UROBILINOGEN, NITRITE, LEUKOCYTESUR Sepsis Labs: @LABRCNTIP (procalcitonin:4,lacticidven:4)  )No results found for this or any previous visit (from the past 240 hour(s)).    Radiology Studies: Ct Angio Head W Or Wo Contrast  Result Date: 06/27/2017 CLINICAL DATA:  51 year old male code stroke. Left side weakness and abnormal speech. Last seen normal 1300 hours yesterday. EXAM: CT ANGIOGRAPHY HEAD AND NECK CT PERFUSION BRAIN TECHNIQUE:  Multidetector CT imaging of the head and neck was performed using the standard protocol during bolus administration of intravenous contrast. Multiplanar CT image reconstructions and MIPs were obtained to evaluate the vascular anatomy. Carotid stenosis measurements (when applicable) are obtained utilizing NASCET criteria, using the distal internal carotid diameter as the denominator. Multiphase CT imaging of the brain was performed following IV bolus contrast injection. Subsequent parametric perfusion maps were calculated using RAPID software. CONTRAST:  90mL ISOVUE-370 IOPAMIDOL (ISOVUE-370) INJECTION 76% COMPARISON:  CT head without contrast 1131 hours today. FINDINGS: CT Brain Perfusion Findings: CBF (<30%) Volume: 0 mL Perfusion (Tmax>6.0s) volume: 0 mL Mismatch Volume: Not applicablemL Infarction Location: Not applicable CTA NECK Skeleton: Intermittent poor dentition. No acute osseous abnormality identified. Mild paranasal sinus mucosal thickening, primarily in the ethmoids. Upper chest: Negative lung apices aside from mild dependent atelectasis. Negative visualized superior mediastinum. Other neck: Negative.  No cervical lymphadenopathy. Aortic arch: 3 vessel arch configuration. No significant arch atherosclerosis. Right carotid system: Unusual small innominate arterial branch  off of the brachiocephalic artery (series 7, image 146), normal variant. Normal right CCA origin. Negative right CCA, right carotid bifurcation, and cervical right ICA. Left carotid system: Normal left CCA origin. Negative left CCA, left carotid bifurcation, and cervical left ICA. Vertebral arteries: Normal proximal right subclavian artery. Normal right vertebral artery origin on series 10, image 151. Tortuous right V1 segment. Normal right vertebral artery to the skullbase. Mild circumferential soft plaque at the proximal left subclavian artery without stenosis. Evidence of soft plaque at the left vert T bull artery origin with up to  moderate stenosis (series 10, image 149). The vertebral arteries are fairly codominant. The left is otherwise negative to the skullbase. CTA HEAD Posterior circulation: Tortuous distal vertebral arteries without atherosclerosis or stenosis. Normal left PICA origin. The right AICA appears dominant. The left V4 segment is somewhat diminutive beyond the left PICA. Patent vertebrobasilar junction without stenosis. Patent AICA origins. Patent basilar artery without stenosis. Patent SCA origins. Normal left PCA origin. Fetal right PCA origin. Left posterior communicating artery diminutive or absent. Bilateral PCA branches are within normal limits. Anterior circulation: Both ICA siphons are patent. No siphon plaque or stenosis identified. Normal ophthalmic artery origins. Normal right posterior communicating artery origin. Patent carotid termini. Normal MCA origins. The left ACA A1 is dominant and the right is diminutive or absent. The anterior communicating artery and bilateral ACA branches are within normal limits. The proximal left M1 segment is tortuous and perhaps mildly irregular, but there is no associated stenosis. The left MCA bifurcation is patent. Left MCA branches are within normal limits. The right MCA M1 segment is patent without stenosis. The right MCA bifurcation is patent. The right MCA branches appear within normal limits. No right MCA branch stenosis or occlusion is identified. Venous sinuses: Patent. Anatomic variants: Mildly dominant distal right vertebral artery. Fetal right PCA origin. Dominant left and diminutive or absent right ACA A1 segments. Small innominate arterial branch off of the proximal brachiocephalic artery in the superior mediastinum. Review of the MIP images confirms the above findings IMPRESSION: 1. Negative for emergent large vessel occlusion. No core infarct or penumbra detected by CT Perfusion. Right MCA branches appear within normal limits. This information was discussed by  telephone with Dr. Caryl Pina on 06/27/2017 at noon. 2. Minimal extracranial atherosclerosis, primarily at the proximal left subclavian artery where there does appear to be mild or moderate stenosis at the Left Vertebral Artery origin. 3. Minimal intracranial atherosclerosis, primarily at the Left MCA M1 segment, with no intracranial stenosis. Electronically Signed   By: Odessa Fleming M.D.   On: 06/27/2017 12:13   Dg Chest 2 View  Result Date: 06/27/2017 CLINICAL DATA:  51 year old male with a history of stroke EXAM: CHEST  2 VIEW COMPARISON:  None. FINDINGS: The heart size and mediastinal contours are within normal limits. Both lungs are clear. The visualized skeletal structures are unremarkable. IMPRESSION: Negative for acute cardiopulmonary disease. Electronically Signed   By: Gilmer Mor D.O.   On: 06/27/2017 19:48   Ct Angio Neck W Or Wo Contrast  Result Date: 06/27/2017 CLINICAL DATA:  52 year old male code stroke. Left side weakness and abnormal speech. Last seen normal 1300 hours yesterday. EXAM: CT ANGIOGRAPHY HEAD AND NECK CT PERFUSION BRAIN TECHNIQUE: Multidetector CT imaging of the head and neck was performed using the standard protocol during bolus administration of intravenous contrast. Multiplanar CT image reconstructions and MIPs were obtained to evaluate the vascular anatomy. Carotid stenosis measurements (when applicable) are obtained utilizing NASCET  criteria, using the distal internal carotid diameter as the denominator. Multiphase CT imaging of the brain was performed following IV bolus contrast injection. Subsequent parametric perfusion maps were calculated using RAPID software. CONTRAST:  90mL ISOVUE-370 IOPAMIDOL (ISOVUE-370) INJECTION 76% COMPARISON:  CT head without contrast 1131 hours today. FINDINGS: CT Brain Perfusion Findings: CBF (<30%) Volume: 0 mL Perfusion (Tmax>6.0s) volume: 0 mL Mismatch Volume: Not applicablemL Infarction Location: Not applicable CTA NECK Skeleton:  Intermittent poor dentition. No acute osseous abnormality identified. Mild paranasal sinus mucosal thickening, primarily in the ethmoids. Upper chest: Negative lung apices aside from mild dependent atelectasis. Negative visualized superior mediastinum. Other neck: Negative.  No cervical lymphadenopathy. Aortic arch: 3 vessel arch configuration. No significant arch atherosclerosis. Right carotid system: Unusual small innominate arterial branch off of the brachiocephalic artery (series 7, image 146), normal variant. Normal right CCA origin. Negative right CCA, right carotid bifurcation, and cervical right ICA. Left carotid system: Normal left CCA origin. Negative left CCA, left carotid bifurcation, and cervical left ICA. Vertebral arteries: Normal proximal right subclavian artery. Normal right vertebral artery origin on series 10, image 151. Tortuous right V1 segment. Normal right vertebral artery to the skullbase. Mild circumferential soft plaque at the proximal left subclavian artery without stenosis. Evidence of soft plaque at the left vert T bull artery origin with up to moderate stenosis (series 10, image 149). The vertebral arteries are fairly codominant. The left is otherwise negative to the skullbase. CTA HEAD Posterior circulation: Tortuous distal vertebral arteries without atherosclerosis or stenosis. Normal left PICA origin. The right AICA appears dominant. The left V4 segment is somewhat diminutive beyond the left PICA. Patent vertebrobasilar junction without stenosis. Patent AICA origins. Patent basilar artery without stenosis. Patent SCA origins. Normal left PCA origin. Fetal right PCA origin. Left posterior communicating artery diminutive or absent. Bilateral PCA branches are within normal limits. Anterior circulation: Both ICA siphons are patent. No siphon plaque or stenosis identified. Normal ophthalmic artery origins. Normal right posterior communicating artery origin. Patent carotid termini. Normal  MCA origins. The left ACA A1 is dominant and the right is diminutive or absent. The anterior communicating artery and bilateral ACA branches are within normal limits. The proximal left M1 segment is tortuous and perhaps mildly irregular, but there is no associated stenosis. The left MCA bifurcation is patent. Left MCA branches are within normal limits. The right MCA M1 segment is patent without stenosis. The right MCA bifurcation is patent. The right MCA branches appear within normal limits. No right MCA branch stenosis or occlusion is identified. Venous sinuses: Patent. Anatomic variants: Mildly dominant distal right vertebral artery. Fetal right PCA origin. Dominant left and diminutive or absent right ACA A1 segments. Small innominate arterial branch off of the proximal brachiocephalic artery in the superior mediastinum. Review of the MIP images confirms the above findings IMPRESSION: 1. Negative for emergent large vessel occlusion. No core infarct or penumbra detected by CT Perfusion. Right MCA branches appear within normal limits. This information was discussed by telephone with Dr. Caryl Pina on 06/27/2017 at noon. 2. Minimal extracranial atherosclerosis, primarily at the proximal left subclavian artery where there does appear to be mild or moderate stenosis at the Left Vertebral Artery origin. 3. Minimal intracranial atherosclerosis, primarily at the Left MCA M1 segment, with no intracranial stenosis. Electronically Signed   By: Odessa Fleming M.D.   On: 06/27/2017 12:13   Mr Brain Wo Contrast  Result Date: 06/27/2017 CLINICAL DATA:  Acute onset LEFT-sided weakness and speech disturbance after nap  yesterday. Follow-up code stroke. History of hypertension. EXAM: MRI HEAD WITHOUT CONTRAST MRA HEAD WITHOUT CONTRAST TECHNIQUE: Multiplanar, multiecho pulse sequences of the brain and surrounding structures were obtained without intravenous contrast. Angiographic images of the head were obtained using MRA technique  without contrast. COMPARISON:  CT HEAD June 27, 2017 at 1131 hours and CTA HEAD June 27, 2017 at 1148 hours FINDINGS: MRI HEAD FINDINGS BRAIN: Confluent reduced diffusion RIGHT basal ganglia measuring to 4 x 1.7 cm with low ADC values. No susceptibility artifact to suggest hemorrhage. 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 Ventricles and sulci are normal for patient's age. No midline shift, mass effect or masses. A few scattered subcentimeter supratentorial white matter FLAIR T2 hyperintensities compatible with mild chronic small vessel ischemic disease. No abnormal extra-axial fluid collections. VASCULAR: Normal major intracranial vascular flow voids present at skull base. SKULL AND UPPER CERVICAL SPINE: No abnormal sellar expansion. No suspicious calvarial bone marrow signal. Craniocervical junction maintained. SINUSES/ORBITS: The mastoid air-cells and included paranasal sinuses are well-aerated. The included ocular globes and orbital contents are non-suspicious. OTHER: None. MRA HEAD FINDINGS- mildly motion degraded examination. ANTERIOR CIRCULATION: Normal flow related enhancement of the included cervical, petrous, cavernous and supraclinoid internal carotid arteries. Patent anterior communicating artery. Proximal severe flow limiting stenosis RIGHT M2 segment and thready distal flow. Otherwise patent anterior and middle cerebral arteries, including distal segments. No large vessel occlusion, flow limiting stenosis, aneurysm. POSTERIOR CIRCULATION: Codominant vertebral artery's. Basilar artery is patent, with normal flow related enhancement of the main branch vessels. Patent posterior cerebral arteries. Fetal origin RIGHT posterior cerebral artery. No large vessel occlusion, flow limiting stenosis,  aneurysm. ANATOMIC VARIANTS: Aplastic RIGHT A1 segment. Source images and MIP images were reviewed. IMPRESSION: MRI HEAD: 1. Acute nonhemorrhagic RIGHT basal ganglia infarct. 2. Mild  chronic small vessel ischemic disease. MRA HEAD: 1. Focal high-grade flow limiting stenosis proximal RIGHT M2 segment. No emergent large vessel occlusion. Acute findings discussed with and reconfirmed by Dr.AROOR, Neurology on 06/27/2017 at 8:40 pm. Electronically Signed   By: Awilda Metro M.D.   On: 06/27/2017 20:45   Ct Cerebral Perfusion W Contrast  Result Date: 06/27/2017 CLINICAL DATA:  51 year old male code stroke. Left side weakness and abnormal speech. Last seen normal 1300 hours yesterday. EXAM: CT ANGIOGRAPHY HEAD AND NECK CT PERFUSION BRAIN TECHNIQUE: Multidetector CT imaging of the head and neck was performed using the standard protocol during bolus administration of intravenous contrast. Multiplanar CT image reconstructions and MIPs were obtained to evaluate the vascular anatomy. Carotid stenosis measurements (when applicable) are obtained utilizing NASCET criteria, using the distal internal carotid diameter as the denominator. Multiphase CT imaging of the brain was performed following IV bolus contrast injection. Subsequent parametric perfusion maps were calculated using RAPID software. CONTRAST:  90mL ISOVUE-370 IOPAMIDOL (ISOVUE-370) INJECTION 76% COMPARISON:  CT head without contrast 1131 hours today. FINDINGS: CT Brain Perfusion Findings: CBF (<30%) Volume: 0 mL Perfusion (Tmax>6.0s) volume: 0 mL Mismatch Volume: Not applicablemL Infarction Location: Not applicable CTA NECK Skeleton: Intermittent poor dentition. No acute osseous abnormality identified. Mild paranasal sinus mucosal thickening, primarily in the ethmoids. Upper chest: Negative lung apices aside from mild dependent atelectasis. Negative visualized superior mediastinum. Other neck: Negative.  No cervical lymphadenopathy. Aortic arch: 3 vessel arch configuration. No significant arch atherosclerosis. Right carotid system: Unusual small innominate arterial branch off of the brachiocephalic artery (series 7, image 146), normal  variant. Normal right CCA origin. Negative right CCA, right carotid bifurcation, and cervical right ICA. Left carotid system:  Normal left CCA origin. Negative left CCA, left carotid bifurcation, and cervical left ICA. Vertebral arteries: Normal proximal right subclavian artery. Normal right vertebral artery origin on series 10, image 151. Tortuous right V1 segment. Normal right vertebral artery to the skullbase. Mild circumferential soft plaque at the proximal left subclavian artery without stenosis. Evidence of soft plaque at the left vert T bull artery origin with up to moderate stenosis (series 10, image 149). The vertebral arteries are fairly codominant. The left is otherwise negative to the skullbase. CTA HEAD Posterior circulation: Tortuous distal vertebral arteries without atherosclerosis or stenosis. Normal left PICA origin. The right AICA appears dominant. The left V4 segment is somewhat diminutive beyond the left PICA. Patent vertebrobasilar junction without stenosis. Patent AICA origins. Patent basilar artery without stenosis. Patent SCA origins. Normal left PCA origin. Fetal right PCA origin. Left posterior communicating artery diminutive or absent. Bilateral PCA branches are within normal limits. Anterior circulation: Both ICA siphons are patent. No siphon plaque or stenosis identified. Normal ophthalmic artery origins. Normal right posterior communicating artery origin. Patent carotid termini. Normal MCA origins. The left ACA A1 is dominant and the right is diminutive or absent. The anterior communicating artery and bilateral ACA branches are within normal limits. The proximal left M1 segment is tortuous and perhaps mildly irregular, but there is no associated stenosis. The left MCA bifurcation is patent. Left MCA branches are within normal limits. The right MCA M1 segment is patent without stenosis. The right MCA bifurcation is patent. The right MCA branches appear within normal limits. No right MCA  branch stenosis or occlusion is identified. Venous sinuses: Patent. Anatomic variants: Mildly dominant distal right vertebral artery. Fetal right PCA origin. Dominant left and diminutive or absent right ACA A1 segments. Small innominate arterial branch off of the proximal brachiocephalic artery in the superior mediastinum. Review of the MIP images confirms the above findings IMPRESSION: 1. Negative for emergent large vessel occlusion. No core infarct or penumbra detected by CT Perfusion. Right MCA branches appear within normal limits. This information was discussed by telephone with Dr. Caryl PinaERIC LINDZEN on 06/27/2017 at noon. 2. Minimal extracranial atherosclerosis, primarily at the proximal left subclavian artery where there does appear to be mild or moderate stenosis at the Left Vertebral Artery origin. 3. Minimal intracranial atherosclerosis, primarily at the Left MCA M1 segment, with no intracranial stenosis. Electronically Signed   By: Odessa FlemingH  Hall M.D.   On: 06/27/2017 12:13   Mr Maxine GlennMra Head Wo Contrast  Result Date: 06/27/2017 CLINICAL DATA:  Acute onset LEFT-sided weakness and speech disturbance after nap yesterday. Follow-up code stroke. History of hypertension. EXAM: MRI HEAD WITHOUT CONTRAST MRA HEAD WITHOUT CONTRAST TECHNIQUE: Multiplanar, multiecho pulse sequences of the brain and surrounding structures were obtained without intravenous contrast. Angiographic images of the head were obtained using MRA technique without contrast. COMPARISON:  CT HEAD June 27, 2017 at 1131 hours and CTA HEAD June 27, 2017 at 1148 hours FINDINGS: MRI HEAD FINDINGS BRAIN: Confluent reduced diffusion RIGHT basal ganglia measuring to 4 x 1.7 cm with low ADC values. No susceptibility artifact to suggest hemorrhage. 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 Ventricles and sulci are normal for patient's age. No midline shift, mass effect or masses. A few scattered subcentimeter supratentorial white matter  FLAIR T2 hyperintensities compatible with mild chronic small vessel ischemic disease. No abnormal extra-axial fluid collections. VASCULAR: Normal major intracranial vascular flow voids present at skull base. SKULL AND UPPER CERVICAL SPINE: No abnormal sellar expansion. No suspicious calvarial  bone marrow signal. Craniocervical junction maintained. SINUSES/ORBITS: The mastoid air-cells and included paranasal sinuses are well-aerated. The included ocular globes and orbital contents are non-suspicious. OTHER: None. MRA HEAD FINDINGS- mildly motion degraded examination. ANTERIOR CIRCULATION: Normal flow related enhancement of the included cervical, petrous, cavernous and supraclinoid internal carotid arteries. Patent anterior communicating artery. Proximal severe flow limiting stenosis RIGHT M2 segment and thready distal flow. Otherwise patent anterior and middle cerebral arteries, including distal segments. No large vessel occlusion, flow limiting stenosis, aneurysm. POSTERIOR CIRCULATION: Codominant vertebral artery's. Basilar artery is patent, with normal flow related enhancement of the main branch vessels. Patent posterior cerebral arteries. Fetal origin RIGHT posterior cerebral artery. No large vessel occlusion, flow limiting stenosis,  aneurysm. ANATOMIC VARIANTS: Aplastic RIGHT A1 segment. Source images and MIP images were reviewed. IMPRESSION: MRI HEAD: 1. Acute nonhemorrhagic RIGHT basal ganglia infarct. 2. Mild chronic small vessel ischemic disease. MRA HEAD: 1. Focal high-grade flow limiting stenosis proximal RIGHT M2 segment. No emergent large vessel occlusion. Acute findings discussed with and reconfirmed by Dr.AROOR, Neurology on 06/27/2017 at 8:40 pm. Electronically Signed   By: Awilda Metro M.D.   On: 06/27/2017 20:45   Ct Head Code Stroke Wo Contrast  Result Date: 06/27/2017 CLINICAL DATA:  Code stroke. Left-sided weakness. Last seen normal yesterday. EXAM: CT HEAD WITHOUT CONTRAST TECHNIQUE:  Contiguous axial images were obtained from the base of the skull through the vertex without intravenous contrast. COMPARISON:  None. FINDINGS: Brain: Normal appearance without evidence of malformation, atrophy, old or acute infarction, mass lesion, hemorrhage, hydrocephalus or extra-axial collection. Vascular: No abnormal vascular finding. Skull: Normal Sinuses/Orbits: Clear/normal Other: None ASPECTS (Alberta Stroke Program Early CT Score) - Ganglionic level infarction (caudate, lentiform nuclei, internal capsule, insula, M1-M3 cortex): 7 - Supraganglionic infarction (M4-M6 cortex): 3 Total score (0-10 with 10 being normal): 10 IMPRESSION: 1. Normal head CT 2. ASPECTS is 10. These results were communicated to Dr. Otelia Limes at 11:36 amon 12/19/2018by text page via the [facility] messaging system. Electronically Signed   By: Paulina Fusi M.D.   On: 06/27/2017 11:38     Scheduled Meds: . aspirin  300 mg Rectal Once  . aspirin  300 mg Rectal Daily   Or  . aspirin  325 mg Oral Daily  . atorvastatin  80 mg Oral q1800  . clopidogrel  75 mg Oral Daily  . enoxaparin (LOVENOX) injection  40 mg Subcutaneous Q24H  . [START ON 06/29/2017] Influenza vac split quadrivalent PF  0.5 mL Intramuscular Tomorrow-1000   Continuous Infusions: . potassium chloride       LOS: 0 days   Time Spent in minutes   30 minutes  Halden Phegley D.O. on 06/28/2017 at 3:28 PM  Between 7am to 7pm - Pager - [phone]  After 7pm go to [email or web address] - password [email or web address]  And look for the night coverage person covering for me after hours  [facility] Office  [phone]

## 2017-06-28 NOTE — Progress Notes (Signed)
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 Pt presents with significant functional decline and requires Max A with ADL and Max A +2 with mobility.  Recommend rehab at CIR. Will follow acutely to address established goals and facilitate DC to next venue of care.    06/28/17 1200  OT Visit Information  Last OT Received On 06/28/17  Assistance Needed +2  PT/OT/SLP Co-Evaluation/Treatment Yes  OT goals addressed during session ADL's and self-care  History of Present Illness 51 y.o. male with no significant medical history as patient has never seen a doctor who presented from home with Left Sided Weakness and inability to stand. Patient does not speak [place] and only speaks [place]agn[place] Jarai Presented with Left sided weakness, slurred speech and fell. Outside tPA time window.  CT head normal.  CTA head and neck and CT cerebral perfusion negative for large vessel occlusion or perfusion deficit. MRI revealed Acute nonhemorrhagic RIGHT basal ganglia infarct  Precautions  Precautions Fall  Restrictions  Weight Bearing Restrictions No  Home Living  Family/patient expects to be discharged to: Private residence  Living Arrangements Other relatives (with 3 young children 828,6, 5 mo; husband)  Available Help at Discharge Available 24 hours/day  Type of Home House  Home Access Level entry  Home Layout One level  Bathroom Nurse, children'shower/Tub Tub/shower unit  Bathroom Toilet Standard  Bathroom Accessibility Yes (think so)  How Accessible (unsure)  Home Equipment None  Additional Comments 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[employer]; drove  Lives With Family  Prior Function  Level of Independence Independent with assistive device(s)  Comments worked Theatre managerchicken processing plant  Communication  Communication Prefers language other than English (Montagnard Estanislado SpireJarai used niece to interpret per family request)  Pain  Assessment  Pain Assessment Faces  Faces Pain Scale 2  Pain Location R shoulder  Pain Descriptors / Indicators Grimacing  Pain Intervention(s) Limited activity within patient's tolerance  Cognition  Arousal/Alertness Awake/alert  Behavior During Therapy Restless  Overall Cognitive Status Difficult to assess  General Comments appears impulsive; decreased awareness of deficits; decreawed attention  Difficult to assess due to Impaired communication (difficulty in interpretation using niece)  Upper Extremity Assessment  Upper Extremity Assessment LUE deficits/detail  LUE Deficits / Details does not move spontaneously, but resopnds to pian; with facilitation, able to moave LUE from shoulder but unable to use functionally today  LUE Sensation decreased light touch;decreased proprioception  LUE Coordination decreased fine motor;decreased gross motor  Lower Extremity Assessment  Lower Extremity Assessment Defer to PT evaluation  LLE Deficits / Details strength in hip, knee and ankle grossly assessed at 2+/5, ROM WFL  LLE Sensation decreased light touch  LLE Coordination decreased fine motor;decreased gross motor  Cervical / Trunk Assessment  Cervical / Trunk Assessment Other exceptions  Cervical / Trunk Exceptions L lateral bias  ADL  Overall ADL's  Needs assistance/impaired  Eating/Feeding Cueing for safety;Cueing for sequencing;Moderate assistance  Eating/Feeding Details (indicate cue type and reason) difficulty getting food to move due to R shoulder limitations  Grooming Moderate assistance;Bed level  Grooming Details (indicate cue type and reason) Able to put on deoderant after top removed; left deoderant bottle under R arm  Upper Body Bathing Moderate assistance;Bed level  Lower Body Bathing Maximal assistance;Sit to/from stand  Upper Body Dressing  Maximal assistance  Lower Body Dressing Maximal assistance  Toilet Transfer +2 for physical assistance;Maximal assistance  Toileting-  Clothing Manipulation and Hygiene Maximal assistance  Functional mobility during ADLs Maximal assistance;+2 for physical assistance  Vision- Assessment  Vision Assessment? Vision impaired- to be further tested in functional context  Additional Comments decreased visual attention  Perception  Comments will further assess  Praxis  Praxis tested? Deficits  Deficits Motor Impersistence  Praxis-Other Comments will further assess  Bed Mobility  Overal bed mobility Needs Assistance  Bed Mobility Rolling;Sidelying to Sit  Rolling Max assist  Sidelying to sit Max assist  Transfers  Overall transfer level Needs assistance  Equipment used 2 person hand held assist (arms over therapist shoulders)  Transfers Stand Pivot Transfers;Sit to/from Stand  Sit to Stand +2 physical assistance;Mod assist  Stand pivot transfers Max assist;+2 physical assistance  General transfer comment maxAx2 for power up and steadying in standing, physical assist for lifting and moving of L LE to step to recliner, no control in descent into chair   Balance  Overall balance assessment Needs assistance  Sitting-balance support Feet supported;Single extremity supported  Sitting balance-Leahy Scale Poor  Sitting balance - Comments L lateral lean  Postural control Left lateral lean  Standing balance-Leahy Scale Zero  OT - End of Session  Equipment Utilized During Treatment Gait belt  Activity Tolerance Patient tolerated treatment well  Patient left in chair;with call bell/phone within reach;with chair alarm set;with family/visitor present  Nurse Communication Mobility status;Need for lift equipment (stedy)  OT Assessment  OT Recommendation/Assessment Patient needs continued OT Services  OT Visit Diagnosis Other abnormalities of gait and mobility (R26.89);Muscle weakness (generalized) (M62.81);Other symptoms and signs involving cognitive function;Hemiplegia and hemiparesis  Hemiplegia - Right/Left Left  Hemiplegia -  dominant/non-dominant Non-Dominant  Hemiplegia - caused by Cerebral infarction  OT Problem List Decreased strength;Decreased range of motion;Decreased activity tolerance;Impaired balance (sitting and/or standing);Impaired vision/perception;Decreased coordination;Decreased cognition;Decreased safety awareness;Decreased knowledge of use of DME or AE;Decreased knowledge of precautions;Impaired sensation;Impaired tone;Impaired UE functional use  OT Plan  OT Frequency (ACUTE ONLY) Min 3X/week  OT Treatment/Interventions (ACUTE ONLY) Self-care/ADL training;Neuromuscular education;DME and/or AE instruction;Therapeutic activities;Cognitive remediation/compensation;Visual/perceptual remediation/compensation;Patient/family education;Balance training  AM-PAC OT "6 Clicks" Daily Activity Outcome Measure  Help from another person eating meals? 3  Help from another person taking care of personal grooming? 2  Help from another person toileting, which includes using toliet, bedpan, or urinal? 2  Help from another person bathing (including washing, rinsing, drying)? 2  Help from another person to put on and taking off regular upper body clothing? 2  Help from another person to put on and taking off regular lower body clothing? 2  6 Click Score 13  ADL G Code Conversion CL  OT Recommendation  Recommendations for Other Services Rehab consult  Follow Up Recommendations CIR;Supervision/Assistance - 24 hour  OT Equipment Tub/shower bench;3 in 1 bedside commode  Individuals Consulted  Consulted and Agree with Results and Recommendations Family member/caregiver;Patient  Family Member Consulted cousin?  Acute Rehab OT Goals  Patient Stated Goal none stated  OT Goal Formulation With patient/family  Time For Goal Achievement 07/12/17  Potential to Achieve Goals Good  OT Time Calculation  OT Start Time (ACUTE ONLY) 1025  OT Stop Time (ACUTE ONLY) 1059  OT Time Calculation (min) 34 min  OT General Charges  $OT  Visit 1 Visit  OT Evaluation  $OT Eval Moderate Complexity 1 Mod  Written Expression  Dominant Hand Right  [facility] Cielo Arias, OT/L  [phone] 06/28/2017

## 2017-06-28 NOTE — Progress Notes (Signed)
Rehab Admissions Coordinator Note:  Patient was screened by Trish MageLogue, Emilly Lavey M for appropriateness for an Inpatient Acute Rehab Consult.  At this time, we are recommending Inpatient Rehab consult.  Trish MageLogue, Desteny Freeman M 06/28/2017, 1:15 PM  I can be reached at [phone].

## 2017-06-28 NOTE — Progress Notes (Signed)
  Speech Language Pathology Treatment: Dysphagia  Patient Details Name: Tim Berger MRN: [id number] DOB: 12/23/1965 Today's Date: 06/28/2017 Time: [id number] SLP Time Calculation (min) (ACUTE ONLY): 10 min  Assessment / Plan / Recommendation Clinical Impression  Skilled treatment session focused on dysphagia goals. SLP facilitiated session by providing skilled observation of pt consuming regular graham crackers and thin liquids via cup and straw. Pt required Mod A verbal, tactile and visual cues to clear left buccal area of residue. Pt required Max A cues to slow rate of consumption and to decrease bolus size. Despite cues, pt with significant left anterior spillage when consuming thin liquids via cup. Straw reduced spillage. Education provided to nursing on recommendation to upgrade diet.    HPI HPI: Tim Berger a 51 y.o.malewithno significantmedical historyas patient has never seen a doctor who presented from home with Left Sided Weakness and inability to stand. Patient does not speak [place] and only speaks [name] so history was obtained from patient's friend who was present at bedside and translated. Patient was doing well until 3:00 pm yesterday when he had some Left sided weakness and fell. He was on the floor when friends got him up to the bed and massaged him to try to make him feel better. This AM he was still unable to stand with assistance so because of concern friends brought him to the hospital. Patient states that he thought he had some slurring of words but does not recall. Denied any N/V/D and denied any CP or SOB. TRH was called to admit for Stroke Workup and Neurology was consulted for further evaluation. MRI revealed acute nonhemorrhagic RIGHT basal ganglia infarct and mild chronic small vessel ischemic disease.      SLP Plan  Continue with current plan of care  Patient needs continued Speech Lanaguage Pathology Services    Recommendations  Diet recommendations:  Dysphagia 2 (fine chop);Thin liquid Liquids provided via: Cup;Straw Medication Administration: Whole meds with liquid Supervision: Staff to assist with self feeding;Full supervision/cueing for compensatory strategies Compensations: Minimize environmental distractions;Slow rate;Small sips/bites;Lingual sweep for clearance of pocketing Postural Changes and/or Swallow Maneuvers: Seated upright 90 degrees                Oral Care Recommendations: Oral care BID Follow up Recommendations: Skilled Nursing facility SLP Visit Diagnosis: Dysphagia, oral phase (R13.11) Plan: Continue with current plan of care       GO           Functional Assessment Tool Used: skilled clinical judgement Functional Limitations: Attention Attention Current Status (Y7829(G9165): At least 40 percent but less than 60 percent impaired, limited or restricted Attention Goal Status (F6213(G9166): At least 20 percent but less than 40 percent impaired, limited or restricted    [name] 06/28/2017, 10:09 AM

## 2017-06-29 ENCOUNTER — Inpatient Hospital Stay (HOSPITAL_COMMUNITY): Payer: BLUE CROSS/BLUE SHIELD

## 2017-06-29 LAB — BASIC METABOLIC PANEL
Anion gap: 9 (ref 5–15)
BUN: 16 mg/dL (ref 6–20)
CHLORIDE: 102 mmol/L (ref 101–111)
CO2: 25 mmol/L (ref 22–32)
Calcium: 9.2 mg/dL (ref 8.9–10.3)
Creatinine, Ser: 0.85 mg/dL (ref 0.61–1.24)
GFR calc Af Amer: >60 mL/min (ref >60)
GFR calc non Af Amer: >60 mL/min (ref >60)
GLUCOSE: 103 mg/dL — AB (ref 65–99)
POTASSIUM: 3.3 mmol/L — AB (ref 3.5–5.1)
Sodium: 136 mmol/L (ref 135–145)

## 2017-06-29 LAB — HOMOCYSTEINE: HOMOCYSTEINE-NORM: 14 umol/L (ref 0.0–15.0)

## 2017-06-29 LAB — LUPUS ANTICOAGULANT PANEL
DRVVT: 40.3 s (ref 0.0–47.0)
PTT LA: 38 s (ref 0.0–51.9)

## 2017-06-29 LAB — ANTINUCLEAR ANTIBODIES, IFA: ANTINUCLEAR ANTIBODIES, IFA: NEGATIVE

## 2017-06-29 MED ORDER — POTASSIUM CHLORIDE 20 MEQ/15ML (10%) PO SOLN
40.0000 meq | Freq: Once | ORAL | Status: AC
  Administered 2017-06-29: 40 meq via ORAL
  Filled 2017-06-29: qty 30

## 2017-06-29 MED ORDER — GADOBENATE DIMEGLUMINE 529 MG/ML IV SOLN
15.0000 mL | Freq: Once | INTRAVENOUS | Status: AC
  Administered 2017-06-29: 12 mL via INTRAVENOUS

## 2017-06-29 NOTE — Progress Notes (Signed)
NEUROHOSPITALISTS STROKE TEAM - DAILY PROGRESS NOTE   SUBJECTIVE (INTERVAL HISTORY) Friend is at the bedside and able to translate information to patient. Pt sitting in bed, awake alert, depressed mood, still has left hemiparesis, but LUE improved some. Pending CIR placement.   His CBC showed small RBC with teardrop cells. Will need outpt hematology consult.   OBJECTIVE Lab Results: CBC:  Recent Labs  Lab 06/27/17 1124 06/27/17 1129 06/28/17 0502  WBC 8.8  --  8.0  HGB 13.3 15.0 13.7  HCT 41.1 44.0 42.1  MCV 67.0*  --  67.5*  PLT 197  --  214   BMP: Recent Labs  Lab 06/27/17 1124 06/27/17 1129 06/28/17 0502 06/29/17 0411  NA 131* 138 137 136  K 3.4* 3.7 3.2* 3.3*  CL 99* 100* 101 102  CO2 22  --  27 25  GLUCOSE 143* 141* 97 103*  BUN 12 15 16 16   CREATININE 0.89 0.80 0.89 0.85  CALCIUM 8.9  --  8.9 9.2  MG  --   --  2.2  --   PHOS  --   --  4.3  --    Liver Function Tests:  Recent Labs  Lab 06/27/17 1124 06/28/17 0502  AST 36 32  ALT 15* 11*  ALKPHOS 110 115  BILITOT 0.6 0.8  PROT 7.6 7.8  ALBUMIN 4.1 4.2   Coagulation Studies:  Recent Labs    06/27/17 1124  APTT 26  INR 1.01   PHYSICAL EXAM Temp:  [97.8 F (36.6 C)-99.1 F (37.3 C)] 98 F (36.7 C) (12/21 1350) Pulse Rate:  [64-89] 89 (12/21 1350) Resp:  [18-20] 20 (12/21 1350) BP: (154-198)/(94-118) 154/104 (12/21 1350) SpO2:  [96 %-100 %] 100 % (12/21 1350) General - Well nourished, well developed, in no apparent distress Respiratory - Lungs clear bilaterally. No wheezing. Cardiovascular - Regular rate and rhythm  Neurological Examination Mental Status: Alert Not oriented to person,place,or time Dysarthric Comprehension intact. Speech fluent in [place].   Cranial Nerves: II: Visual fields intact bilaterally. PERRL III,IV, VI: ptosis not present, extra-ocular motions intact bilaterally  V,VII:Left-sided facial droop, facial  light touch sensationdecreased on the left VIII: hearinggrosslynormal bilaterally IX,X:Unable to assess at this time XL:KGMWNUXI:Unable to assess at this time UVO:ZDGUYQXII:Tongue deviates to the left Motor: Right :Upper extremity 5/5 Left: Upper extremity 2+/5    Lower extremity 5/5 Lower extremity4/5 with drift Sensory: Pinprick and light touchdecreased in the left upper extremity Deep Tendon Reflexes:2+in the right upper extremity, 3+ in the left upper extremity, patellar reflex 2+ in bilateral lower extremities, ankle reflex 0 bilaterally Plantars: Right: downgoingLeft: Upgoing Cerebellar: Normal finger-to-nose(right upper extremity) Gait:Unable to assess  IMAGING: I have personally reviewed the radiological images below and agree with the radiology interpretations. Ct Angio Head and neck and perfusion W Or Wo Contrast Result Date: 06/27/2017 IMPRESSION: 1. Negative for emergent large vessel occlusion. No core infarct or penumbra detected by CT Perfusion. Right MCA branches appear within normal limits. 2. Minimal extracranial atherosclerosis, primarily at the proximal left subclavian artery where there does appear to be mild or moderate stenosis at the Left Vertebral Artery origin. 3. Minimal intracranial atherosclerosis, primarily at the Left MCA M1 segment, with no intracranial stenosis. Electronically Signed   By: Odessa FlemingH  Hall M.D.   On: 06/27/2017 12:13   Dg Chest 2 View Result Date: 06/27/2017 CLINICAL DATA:  51 year old male with a history of stroke EXAM: CHEST  2 VIEW COMPARISON:  None. FINDINGS: The heart size and mediastinal  contours are within normal limits. Both lungs are clear. The visualized skeletal structures are unremarkable. IMPRESSION: Negative for acute cardiopulmonary disease. Electronically Signed   By: Gilmer MorJaime  Wagner D.O.   On: 06/27/2017 19:48   MRI/MRA Brain Wo  Contrast Result Date: 06/27/2017  IMPRESSION: MRI HEAD: 1. Acute nonhemorrhagic RIGHT basal ganglia infarct. 2. Mild chronic small vessel ischemic disease. MRA HEAD: 1. Focal high-grade flow limiting stenosis proximal RIGHT M2 segment. No emergent large vessel occlusion. Acute findings discussed with and reconfirmed by Dr.AROOR, Neurology on 06/27/2017 at 8:40 pm. Electronically Signed   By: Awilda Metroourtnay  Bloomer M.D.   On: 06/27/2017 20:45   Ct Head Code Stroke Wo Contrast Result Date: 06/27/2017 IMPRESSION: 1. Normal head CT 2. ASPECTS is 10.   Echocardiogram:  Study Conclusions - Left ventricle: The cavity size was normal. There was moderate   focal basal hypertrophy of the septum with otherwise mild   concetric hypertrophy. Systolic function was vigorous. The   estimated ejection fraction was in the range of 65% to 70%. Wall   motion was normal; there were no regional wall motion   abnormalities. Doppler parameters are consistent with abnormal   left ventricular relaxation (grade 1 diastolic dysfunction).   Doppler parameters are consistent with high ventricular filling   pressure. - Aortic valve: Transvalvular velocity was within the normal range.   There was no stenosis. There was no regurgitation. - Ascending aorta: The ascending aorta was mildly dilated. - Mitral valve: Transvalvular velocity was within the normal range.   There was no evidence for stenosis. There was no regurgitation. - Left atrium: The atrium was moderately dilated. - Right ventricle: The cavity size was normal. Wall thickness was   normal. Systolic function was normal. - Atrial septum: A patent foramen ovale cannot be excluded. - Tricuspid valve: There was no regurgitation.  B/L LE Duplex:  Neg for DVT  TCD with bubbles:  No window     ASSESSMENT: Tim Berger is a 51 y.o. male with no significant past medical history presenting with dysarthria and left hemiparesis.Symptoms started at 1800 yesterday.He  is out of the tPA time window.CT head normal. CTA head and neck and CT cerebral perfusion negative for large vessel occlusion or perfusion deficit. There is minimal extracranial atherosclerosis and mild or moderate stenosis at the left vertebral artery origin.  MRI reveals  Stroke: Acute RIGHT BG/CR large infarct, likely large vessel athero with right M2 high grade stenosis Resultant Symptoms: dysarthria and left hemiparesis Stroke Risk Factors: hyperlipidemia and hypertension PLAN  06/29/2017: Continue Aspirin/ Plavix/ Statin Frequent neuro checks Telemetry monitoring PT/OT/SLP - recommend CIR Will need 30-day cardiac event monitor at discharge Ongoing aggressive stroke risk factor management [facility] 6 weeks  INTRACRANIAL Atherosclerosis &Stenosis: Focal high-grade flow limiting stenosis proximal RIGHT M2 segment. On DAPT, continue for 3 months and then Plavix alone  DYSPHAGIA: Passed SLP swallow evaluation with dysphagia 2 diet Aspiration Precautions in progress  HYPERTENSION: elevated blood pressures noted overnight. Long term BP goal normotensive. May slowly start B/P medications after 48 hours, if needed Home Meds: NONE  HYPERLIPIDEMIA:    Component Value Date/Time   CHOL 269 (H) 06/28/2017 0502   TRIG 143 06/28/2017 0502   HDL 58 06/28/2017 0502   CHOLHDL 4.6 06/28/2017 0502   VLDL 29 06/28/2017 0502   LDLCALC 182 (H) 06/28/2017 0502  Home Meds:  NONE LDL 182, goal < 70 Started on  Lipitor 80 mg daily Continue statin  at discharge  Follow Up:  Follow-up Information    Nilda Riggs, NP. Schedule an appointment as soon as possible for a visit in 6 week(s).   Specialty:  Family Medicine Contact information: [address] [place] [address] [phone]          Patient, No Pcp Per -PCP Follow up in 1-2 weeks   Case Management aware of need  ATTENDING NOTE: I reviewed above note and agree with  the assessment and plan. I have made any additions or clarifications directly to the above note. Pt was seen and examined.   50 year old male with no PMH admitted for left-sided weakness and slurred speech.  Found to have high blood pressure.  MRI showed right BG/CR moderate-sized infarct.  MRA and CTA showed right M2 cutoff.  CTP negative.  LE venous Doppler no DVT.  EF 65-70%.  TCD bubble study unsuccessful due to lack of the temporal window.  Hypercoagulable workup pending.  LDL 182 and A1c 6.0.  UDS pending.   Pt still has left hemiparesis. BP still high. Pt stroke felt to be due to intracranial stenosis with risk factor of HTN and HLD. His CBC showed small RBC with teardrop cells, not sure about the significance. Need outpt hematology referral and follow up. Put on DAPT and high dose lipitor. Recommend 30 day cardiac event monitoring as outpt to rule out afib. PT/OT recommend CIR.    Neurology will sign off. Please call with questions. Pt will follow up with Dr. Roda Shutters at [facility] in about 6 weeks. Thanks for the consult.   Marvel Plan, MD PhD Stroke Neurology 06/29/2017 4:03 PM   To contact Stroke Continuity provider, please refer to [email or web address]. After hours, contact General Neurology

## 2017-06-29 NOTE — Progress Notes (Signed)
Physical Therapy Treatment Patient Details Name: Tim Berger MRN: [id number] DOB: 10/06/1965 Today's Date: 06/29/2017    History of Present Illness 51 y.o. male with no significant medical history as patient has never seen a doctor who presented from home with Left Sided Weakness and inability to stand. Patient does not speak [place] and only speaks [place] Jarai Presented with Left sided weakness, slurred speech and fell. Outside tPA time window.  CT head normal.  CTA head and neck and CT cerebral perfusion negative for large vessel occlusion or perfusion deficit. MRI revealed Acute nonhemorrhagic RIGHT basal ganglia infarct    PT Comments    Pt is making progress towards his goals however is still limited in his mobility by L sided weakness and R shoulder pain with movement. Pt is currently modAx2 for bed mobility, and maxAx2 for transfers. Pt able to maintain sitting balance for 30 sec without assist and stand for 90 sec with L knee blocked and modAx2. D/c plans remain appropriate. PT will continue to follow acutely.    Follow Up Recommendations  CIR     Equipment Recommendations  Other (comment)(to be determined at next venue)    Recommendations for Other Services Rehab consult     Precautions / Restrictions Precautions Precautions: Fall Restrictions Weight Bearing Restrictions: No    Mobility  Bed Mobility Overal bed mobility: Needs Assistance Bed Mobility: Rolling;Sidelying to Sit Rolling: Mod assist Sidelying to sit: +2 for physical assistance;Mod assist       General bed mobility comments: modAx2 for rolling to R, modAx2 for assist with management of L LE off bed, trunk to upright and pad scoot of hips to EoB  Transfers Overall transfer level: Needs assistance Equipment used: 2 person hand held assist(arms over therapist shoulders) Transfers: Stand Pivot Transfers   Stand pivot transfers: Max assist;+2 physical assistance       General transfer comment:  initial attempt maxAx2 for sit<>stand, unable to maintain and sat back down, 2nd attempt able to stand pivot to chair with maxAx2, able to perform 1x sit>stand from recliner with modAx2 and maintain standing for 90 sec before fatigue and sitting back down.   Ambulation/Gait             General Gait Details: unable      Modified Rankin (Stroke Patients Only) Modified Rankin (Stroke Patients Only) Pre-Morbid Rankin Score: No symptoms Modified Rankin: Severe disability     Balance Overall balance assessment: Needs assistance Sitting-balance support: Feet supported;Single extremity supported Sitting balance-Leahy Scale: Poor Sitting balance - Comments: initially requires assist against L lateral lean, with max verbal cuing able to maintain upright for 30 seconds without assist  Postural control: Left lateral lean   Standing balance-Leahy Scale: Zero                              Cognition Arousal/Alertness: Awake/alert Behavior During Therapy: Restless Overall Cognitive Status: Difficult to assess                                 General Comments: appears impulsive; decreased awareness of deficits; decreawed attention      Exercises General Exercises - Upper Extremity Shoulder Flexion: AAROM;Left;5 reps Elbow Flexion: AAROM;Left;5 reps Elbow Extension: AAROM;Left;5 reps Wrist Flexion: AAROM;Left;5 reps Wrist Extension: AAROM;Left;5 reps Digit Composite Flexion: AAROM;Left;5 reps General Exercises - Lower Extremity Ankle Circles/Pumps: AAROM;Left;10 reps;Supine Short Arc Quad: AAROM;Left;10  reps;Supine Heel Slides: AAROM;Left;10 reps;Supine Straight Leg Raises: AAROM;Left;10 reps;Supine    General Comments General comments (skin integrity, edema, etc.): Pt niece assisted in interpretation however she is easily distracted by phone       Pertinent Vitals/Pain Pain Assessment: Faces Pain Score: 2  Faces Pain Scale: Hurts a little bit Pain  Location: R shoulder Pain Descriptors / Indicators: Grimacing Pain Intervention(s): Monitored during session;Limited activity within patient's tolerance;Repositioned           PT Goals (current goals can now be found in the care plan section) Acute Rehab PT Goals Patient Stated Goal: none stated PT Goal Formulation: Patient unable to participate in goal setting Time For Goal Achievement: 07/12/17 Potential to Achieve Goals: Fair Progress towards PT goals: Progressing toward goals    Frequency    Min 4X/week      PT Plan Current plan remains appropriate    Co-evaluation PT/OT/SLP Co-Evaluation/Treatment: Yes            AM-PAC PT "6 Clicks" Daily Activity  Outcome Measure  Difficulty turning over in bed (including adjusting bedclothes, sheets and blankets)?: Unable Difficulty moving from lying on back to sitting on the side of the bed? : Unable Difficulty sitting down on and standing up from a chair with arms (e.g., wheelchair, bedside commode, etc,.)?: Unable Help needed moving to and from a bed to chair (including a wheelchair)?: Total Help needed walking in hospital room?: Total Help needed climbing 3-5 steps with a railing? : Total 6 Click Score: 6    End of Session Equipment Utilized During Treatment: Gait belt Activity Tolerance: Patient tolerated treatment well Patient left: in chair;with call bell/phone within reach;with chair alarm set;with family/visitor present Nurse Communication: Mobility status;Other (comment)(need for Stedy to return to bed) PT Visit Diagnosis: Unsteadiness on feet (R26.81);Other abnormalities of gait and mobility (R26.89);Muscle weakness (generalized) (M62.81);History of falling (Z91.81);Difficulty in walking, not elsewhere classified (R26.2);Other symptoms and signs involving the nervous system (R29.898);Hemiplegia and hemiparesis Hemiplegia - Right/Left: Left Hemiplegia - dominant/non-dominant: Non-dominant Hemiplegia - caused by:  Cerebral infarction     Time: [id number] PT Time Calculation (min) (ACUTE ONLY): 19 min  Charges:  $Therapeutic Activity: 8-22 mins                    G Codes:  Functional Assessment Tool Used: AM-PAC 6 Clicks Basic Mobility Functional Limitation: Mobility: Walking and moving around Mobility: Walking and Moving Around Current Status (V4098(G8978): 100 percent impaired, limited or restricted Mobility: Walking and Moving Around Goal Status (J1914(G8979): At least 60 percent but less than 80 percent impaired, limited or restricted    [name] PT, DPT Acute Rehabilitation  [phone] Pager [phone]     [name] 06/29/2017, 12:41 PM

## 2017-06-29 NOTE — Progress Notes (Signed)
PROGRESS NOTE    Kalid Lerner  ZOX:[id number] DOB: 04/27/1966 DOA: 06/27/2017 PCP: Patient, No Pcp Per   Chief Complaint  PaRudean Curttient presents with  . Cerebrovascular Accident    Brief Narrative:  HPI on 06/27/2017 by Dr. Marguerita Merlesmair Sheikh Rudean CurtKut Dahm is a 51 y.o. male with no significant medical history as patient has never seen a doctor who presented from home with Left Sided Weakness and inability to stand. Patient does not speak [place] and only speaks [name] so history was obtained from patient's friend who was present at bedside and translated. Patient was doing well until 3:00 pm yesterday when he had some Left sided weakness and fell. He was on the floor when friends got him up to the bed and massaged him to try to make him feel better. This AM he was still unable to stand with assistance so because of concern friends brought him to the hospital. Patient states that he thought he had some slurring of words but does not recall. Denied any N/V/D and denied any CP or SOB. [facility] was called to admit for Stroke Workup and Neurology was consulted for further evaluation.   Assessment & Plan   Acute CVA -Patient presented with left-sided weakness -CT head unremarkable -CTA head and neck is negative for marked large vessel occlusion. -MRI of brain showed acute nonhemorrhagic right basal ganglia infarct. Mild chronic small vessel ischemic disease. -MRA head showed focal high-grade flow limiting stenosis proximal right M2 segment. No emergent large vessel occlusion. -Echocardiogram shows an EF of 65-70%, grade 1 diastolic dysfunction -Hemoglobin A1c 6, LDL 182 -Continue statin, Plavix, aspirin -Neurology consulted and appreciated. -PT/OT recommended CIR- pending insurance approval -discussed with neurology, Dr. Roda ShuttersXu, recommended 30 day event monitor- cardiology made aware  Right Upper extremity - limited ROM -Per physical therapy, suspect patient may have rotator cuff injury.  -MRI showed: Complete  fatty replacement of all the rotator cuff musculature without tendon tear. Edema in the imaged trapezius is consistent with early denervation atrophy change. Moderately severe atrophy of the deltoid is also present  -patient may want to follow up with orthopedics as an outpatient  Accelerated hypertension -Allowing for permissive hypertension given the acute CVA  Hyponatremia -Resolved, continue to monitor BMP  Hypokalemia -Potassium 3.3 today, continue replace and monitor BMP  -magnesium 2.2  Hyperglycemia -Hemoglobin A1c 6 -Continue to monitor closely  DVT Prophylaxis  lovenox  Code Status: Full  Family Communication: Family at bedside  Disposition Plan: Admitted. Pending CIR vs SNF  Consultants Neurology Inpatient Rehab  Procedures  Echocardiogram  Antibiotics   Anti-infectives (From admission, onward)   None      Subjective:   Chao Chern seen and examined today. Patient's niece used as a Nurse, learning disabilitytranslator. Currently no complaints. Still unable to move his left side.   Objective:   Vitals:   06/28/17 2149 06/29/17 0049 06/29/17 0514 06/29/17 0945  BP: (!) 185/100 (!) 164/94 (!) 198/99 (!) 163/118  Pulse: 72 77 64 87  Resp: 20 18 20 20   Temp: 97.8 F (36.6 C) 98 F (36.7 C) 98.3 F (36.8 C) 98.1 F (36.7 C)  TempSrc: Oral Oral Oral Oral  SpO2: 96% 98% 99% 99%    Intake/Output Summary (Last 24 hours) at 06/29/2017 1241 Last data filed at 06/29/2017 0514 Gross per 24 hour  Intake 680 ml  Output 750 ml  Net -70 ml   There were no vitals filed for this visit.   Exam  General: Well developed, well nourished, NAD,  appears stated age  HEENT: NCAT, mucous membranes moist.   Cardiovascular: S1 S2 auscultated, no murmurs, RRR  Respiratory: Clear to auscultation bilaterally with equal chest rise, no wheezing  Abdomen: Soft, nontender, nondistended, + bowel sounds  Extremities: warm dry without cyanosis clubbing or edema  Neuro: AAOx3, left sided  weakness, no new findings  Psych: Appropriate (difficult to truly assess given language barrier)  Data Reviewed: I have personally reviewed following labs and imaging studies  CBC: Recent Labs  Lab 06/27/17 1124 06/27/17 1129 06/28/17 0502  WBC 8.8  --  8.0  NEUTROABS 5.2  --  4.9  HGB 13.3 15.0 13.7  HCT 41.1 44.0 42.1  MCV 67.0*  --  67.5*  PLT 197  --  214   Basic Metabolic Panel: Recent Labs  Lab 06/27/17 1124 06/27/17 1129 06/28/17 0502 06/29/17 0411  NA 131* 138 137 136  K 3.4* 3.7 3.2* 3.3*  CL 99* 100* 101 102  CO2 22  --  27 25  GLUCOSE 143* 141* 97 103*  BUN 12 15 16 16   CREATININE 0.89 0.80 0.89 0.85  CALCIUM 8.9  --  8.9 9.2  MG  --   --  2.2  --   PHOS  --   --  4.3  --    GFR: CrCl cannot be calculated (Unknown ideal weight.). Liver Function Tests: Recent Labs  Lab 06/27/17 1124 06/28/17 0502  AST 36 32  ALT 15* 11*  ALKPHOS 110 115  BILITOT 0.6 0.8  PROT 7.6 7.8  ALBUMIN 4.1 4.2   No results for input(s): LIPASE, AMYLASE in the last 168 hours. No results for input(s): AMMONIA in the last 168 hours. Coagulation Profile: Recent Labs  Lab 06/27/17 1124  INR 1.01   Cardiac Enzymes: No results for input(s): CKTOTAL, CKMB, CKMBINDEX, TROPONINI in the last 168 hours. BNP (last 3 results) No results for input(s): PROBNP in the last 8760 hours. HbA1C: Recent Labs    06/28/17 0502  HGBA1C 6.0*   CBG: Recent Labs  Lab 06/27/17 1127  GLUCAP 133*   Lipid Profile: Recent Labs    06/28/17 0502  CHOL 269*  HDL 58  LDLCALC 182*  TRIG 143  CHOLHDL 4.6   Thyroid Function Tests: No results for input(s): TSH, T4TOTAL, FREET4, T3FREE, THYROIDAB in the last 72 hours. Anemia Panel: No results for input(s): VITAMINB12, FOLATE, FERRITIN, TIBC, IRON, RETICCTPCT in the last 72 hours. Urine analysis: No results found for: COLORURINE, APPEARANCEUR, LABSPEC, PHURINE, GLUCOSEU, HGBUR, BILIRUBINUR, KETONESUR, PROTEINUR, UROBILINOGEN, NITRITE,  LEUKOCYTESUR Sepsis Labs: @LABRCNTIP (procalcitonin:4,lacticidven:4)  )No results found for this or any previous visit (from the past 240 hour(s)).    Radiology Studies: Dg Chest 2 View  Result Date: 06/27/2017 CLINICAL DATA:  51 year old male with a history of stroke EXAM: CHEST  2 VIEW COMPARISON:  None. FINDINGS: The heart size and mediastinal contours are within normal limits. Both lungs are clear. The visualized skeletal structures are unremarkable. IMPRESSION: Negative for acute cardiopulmonary disease. Electronically Signed   By: Gilmer MorJaime  Wagner D.O.   On: 06/27/2017 19:48   Mr Brain Wo Contrast  Result Date: 06/27/2017 CLINICAL DATA:  Acute onset LEFT-sided weakness and speech disturbance after nap yesterday. Follow-up code stroke. History of hypertension. EXAM: MRI HEAD WITHOUT CONTRAST MRA HEAD WITHOUT CONTRAST TECHNIQUE: Multiplanar, multiecho pulse sequences of the brain and surrounding structures were obtained without intravenous contrast. Angiographic images of the head were obtained using MRA technique without contrast. COMPARISON:  CT HEAD June 27, 2017 at  1131 hours and CTA HEAD June 27, 2017 at 1148 hours FINDINGS: MRI HEAD FINDINGS BRAIN: Confluent reduced diffusion RIGHT basal ganglia measuring to 4 x 1.7 cm with low ADC values. No susceptibility artifact to suggest hemorrhage. 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 Ventricles and sulci are normal for patient's age. No midline shift, mass effect or masses. A few scattered subcentimeter supratentorial white matter FLAIR T2 hyperintensities compatible with mild chronic small vessel ischemic disease. No abnormal extra-axial fluid collections. VASCULAR: Normal major intracranial vascular flow voids present at skull base. SKULL AND UPPER CERVICAL SPINE: No abnormal sellar expansion. No suspicious calvarial bone marrow signal. Craniocervical junction maintained. SINUSES/ORBITS: The mastoid air-cells and  included paranasal sinuses are well-aerated. The included ocular globes and orbital contents are non-suspicious. OTHER: None. MRA HEAD FINDINGS- mildly motion degraded examination. ANTERIOR CIRCULATION: Normal flow related enhancement of the included cervical, petrous, cavernous and supraclinoid internal carotid arteries. Patent anterior communicating artery. Proximal severe flow limiting stenosis RIGHT M2 segment and thready distal flow. Otherwise patent anterior and middle cerebral arteries, including distal segments. No large vessel occlusion, flow limiting stenosis, aneurysm. POSTERIOR CIRCULATION: Codominant vertebral artery's. Basilar artery is patent, with normal flow related enhancement of the main branch vessels. Patent posterior cerebral arteries. Fetal origin RIGHT posterior cerebral artery. No large vessel occlusion, flow limiting stenosis,  aneurysm. ANATOMIC VARIANTS: Aplastic RIGHT A1 segment. Source images and MIP images were reviewed. IMPRESSION: MRI HEAD: 1. Acute nonhemorrhagic RIGHT basal ganglia infarct. 2. Mild chronic small vessel ischemic disease. MRA HEAD: 1. Focal high-grade flow limiting stenosis proximal RIGHT M2 segment. No emergent large vessel occlusion. Acute findings discussed with and reconfirmed by Dr.AROOR, Neurology on 06/27/2017 at 8:40 pm. Electronically Signed   By: Awilda Metro M.D.   On: 06/27/2017 20:45   Mr Shoulder Right W Wo Contrast  Result Date: 06/29/2017 CLINICAL DATA:  Patient admitted 06/27/2017 with left-sided weakness and inability to stand. Right shoulder pain. Question septic joint. EXAM: MRI OF THE RIGHT SHOULDER WITHOUT AND WITH CONTRAST TECHNIQUE: Multiplanar, multisequence MR imaging of the right shoulder was performed before and after the administration of intravenous contrast. CONTRAST:  12 ml MULTIHANCE GADOBENATE DIMEGLUMINE 529 MG/ML IV SOLN COMPARISON:  None. FINDINGS: Rotator cuff:  Intact. Muscles: There is severe fatty atrophy of all the  rotator cuff muscles. Moderately severe fatty atrophy of the deltoid is also seen. Intermediate increased T2 signal is seen throughout the imaged trapezius. The imaged triceps appears normal. Biceps long head:  Intact. Acromioclavicular Joint: Normal. Type 1 acromion. No subacromial/subdeltoid bursal fluid. Glenohumeral Joint: Appears normal.  No evidence of infection. Labrum:  Evaluation is limited by motion but no tear is identified. Bones: Normal marrow signal throughout. No evidence of fracture, infection or focal lesion. Other: None. IMPRESSION: Complete fatty replacement of all the rotator cuff musculature without tendon tear. Edema in the imaged trapezius is consistent with early denervation atrophy change. Moderately severe atrophy of the deltoid is also present. Cause for these findings is not identified. Negative for septic joint or other acute abnormality. Electronically Signed   By: Drusilla Kanner M.D.   On: 06/29/2017 09:25   Mr Maxine Glenn Head Wo Contrast  Result Date: 06/27/2017 CLINICAL DATA:  Acute onset LEFT-sided weakness and speech disturbance after nap yesterday. Follow-up code stroke. History of hypertension. EXAM: MRI HEAD WITHOUT CONTRAST MRA HEAD WITHOUT CONTRAST TECHNIQUE: Multiplanar, multiecho pulse sequences of the brain and surrounding structures were obtained without intravenous contrast. Angiographic images of the head were obtained using MRA  technique without contrast. COMPARISON:  CT HEAD June 27, 2017 at 1131 hours and CTA HEAD June 27, 2017 at 1148 hours FINDINGS: MRI HEAD FINDINGS BRAIN: Confluent reduced diffusion RIGHT basal ganglia measuring to 4 x 1.7 cm with low ADC values. No susceptibility artifact to suggest hemorrhage. 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 Ventricles and sulci are normal for patient's age. No midline shift, mass effect or masses. A few scattered subcentimeter supratentorial white matter FLAIR T2 hyperintensities compatible  with mild chronic small vessel ischemic disease. No abnormal extra-axial fluid collections. VASCULAR: Normal major intracranial vascular flow voids present at skull base. SKULL AND UPPER CERVICAL SPINE: No abnormal sellar expansion. No suspicious calvarial bone marrow signal. Craniocervical junction maintained. SINUSES/ORBITS: The mastoid air-cells and included paranasal sinuses are well-aerated. The included ocular globes and orbital contents are non-suspicious. OTHER: None. MRA HEAD FINDINGS- mildly motion degraded examination. ANTERIOR CIRCULATION: Normal flow related enhancement of the included cervical, petrous, cavernous and supraclinoid internal carotid arteries. Patent anterior communicating artery. Proximal severe flow limiting stenosis RIGHT M2 segment and thready distal flow. Otherwise patent anterior and middle cerebral arteries, including distal segments. No large vessel occlusion, flow limiting stenosis, aneurysm. POSTERIOR CIRCULATION: Codominant vertebral artery's. Basilar artery is patent, with normal flow related enhancement of the main branch vessels. Patent posterior cerebral arteries. Fetal origin RIGHT posterior cerebral artery. No large vessel occlusion, flow limiting stenosis,  aneurysm. ANATOMIC VARIANTS: Aplastic RIGHT A1 segment. Source images and MIP images were reviewed. IMPRESSION: MRI HEAD: 1. Acute nonhemorrhagic RIGHT basal ganglia infarct. 2. Mild chronic small vessel ischemic disease. MRA HEAD: 1. Focal high-grade flow limiting stenosis proximal RIGHT M2 segment. No emergent large vessel occlusion. Acute findings discussed with and reconfirmed by Dr.AROOR, Neurology on 06/27/2017 at 8:40 pm. Electronically Signed   By: Awilda Metro M.D.   On: 06/27/2017 20:45     Scheduled Meds: . aspirin  300 mg Rectal Once  . aspirin  300 mg Rectal Daily   Or  . aspirin  325 mg Oral Daily  . atorvastatin  80 mg Oral q1800  . clopidogrel  75 mg Oral Daily  . enoxaparin (LOVENOX)  injection  40 mg Subcutaneous Q24H  . Influenza vac split quadrivalent PF  0.5 mL Intramuscular Tomorrow-1000   Continuous Infusions:    LOS: 1 day   Time Spent in minutes   30 minutes  Aza Dantes D.O. on 06/29/2017 at 12:41 PM  Between 7am to 7pm - Pager - [phone]  After 7pm go to [email or web address] - password [email or web address]  And look for the night coverage person covering for me after hours  [facility] Office  [phone]

## 2017-06-29 NOTE — Progress Notes (Signed)
I met with pt with non English speaking male in his room. I then contacted his niece, Mio Rolan, by phone who is [place] speaking. I briefly discussed the idea of an inpt rehab admit Monday pending [employer] approval She states pt lives with her sister and her family ( his niece). I will arrange for a Jarsi interpreter to meet with pt and I  Monday morning to further discuss his rehab needs. [phone]

## 2017-06-29 NOTE — Progress Notes (Signed)
MRI to come for shoulder.

## 2017-06-29 NOTE — Progress Notes (Signed)
  Speech Language Pathology Treatment: Dysphagia;Cognitive-Linquistic  Patient Details Name: Tim Berger MRN: [id number] DOB: 09/04/1965 Today's Date: 06/29/2017 Time: [id number] SLP Time Calculation (min) (ACUTE ONLY): 16 min  Assessment / Plan / Recommendation Clinical Impression  Skilled treatment session focused on cognition and dysphagia goals. Niece present and pt/her requesting that she interpret. SLP facilitated session by providing skilled observation of pt consuming trial of graham cracker. Pt with increased left pocketing and anterior spillage of bolus. Pt required Mod A cues for awareness and to use lingual sweep to clear residue. Not recommending upgrade at this time d/t difficulty following cues for lingual sweeps and over decreased awareness of impairment with oral function. SLP further facilitated session by providing supervision cues for orientation information. Pt continues to produce mostly phrase level responses. His niece reports that his intelligibility is good/at baseline. ST to continue to follow.    HPI HPI: Tim Berger a 51 y.o.malewithno significantmedical historyas patient has never seen a doctor who presented from home with Left Sided Weakness and inability to stand. Patient does not speak [place] and only speaks [name] so history was obtained from patient's friend who was present at bedside and translated. Patient was doing well until 3:00 pm yesterday when he had some Left sided weakness and fell. He was on the floor when friends got him up to the bed and massaged him to try to make him feel better. This AM he was still unable to stand with assistance so because of concern friends brought him to the hospital. Patient states that he thought he had some slurring of words but does not recall. Denied any N/V/D and denied any CP or SOB. TRH was called to admit for Stroke Workup and Neurology was consulted for further evaluation. MRI revealed acute nonhemorrhagic RIGHT  basal ganglia infarct and mild chronic small vessel ischemic disease.      SLP Plan  Continue with current plan of care       Recommendations  Diet recommendations: Dysphagia 2 (fine chop);Thin liquid Liquids provided via: Cup;Straw Medication Administration: Whole meds with liquid Supervision: Staff to assist with self feeding;Full supervision/cueing for compensatory strategies Compensations: Minimize environmental distractions;Slow rate;Small sips/bites;Lingual sweep for clearance of pocketing Postural Changes and/or Swallow Maneuvers: Seated upright 90 degrees                General recommendations: Rehab consult Oral Care Recommendations: Oral care BID Follow up Recommendations: Skilled Nursing facility SLP Visit Diagnosis: Dysphagia, oral phase (R13.11) Plan: Continue with current plan of care       GO                Jenness Stemler 06/29/2017, 10:41 AM

## 2017-06-29 NOTE — Progress Notes (Signed)
Late entry for 06/28/17 @2130 :  Patient awake, appears to be in no pain. Unable to assess patient d/t language barrier. No family/friends in room at this time. Tele Interpreter was unable to work..Marland Kitchen

## 2017-06-29 NOTE — Progress Notes (Signed)
I have secured interpreter for me to discuss with patient Monday his rehab needs and options. Y, Keo at 9 am with pt and myself. [phone]

## 2017-06-30 DIAGNOSIS — E785 Hyperlipidemia, unspecified: Secondary | ICD-10-CM

## 2017-06-30 LAB — BASIC METABOLIC PANEL
ANION GAP: 9 (ref 5–15)
BUN: 17 mg/dL (ref 6–20)
CHLORIDE: 103 mmol/L (ref 101–111)
CO2: 26 mmol/L (ref 22–32)
Calcium: 9.4 mg/dL (ref 8.9–10.3)
Creatinine, Ser: 1 mg/dL (ref 0.61–1.24)
GFR calc Af Amer: >60 mL/min (ref >60)
GFR calc non Af Amer: >60 mL/min (ref >60)
GLUCOSE: 102 mg/dL — AB (ref 65–99)
POTASSIUM: 3.6 mmol/L (ref 3.5–5.1)
Sodium: 138 mmol/L (ref 135–145)

## 2017-06-30 NOTE — Progress Notes (Signed)
PROGRESS NOTE    Tim Berger  WUJ:[id number] DOB: [birth year] DOA: 06/27/2017 PCP: Patient, No Pcp Per   Chief Complaint  Patient presents with  . Cerebrovascular Accident    Brief Narrative:  HPI on 06/27/2017 by Dr. Marguerita Merles Tejon Tim Berger is a 51 y.o. male with no significant medical history as patient has never seen a doctor who presented from home with Left Sided Weakness and inability to stand. Patient does not speak [place] and only speaks [name] so history was obtained from patient's friend who was present at bedside and translated. Patient was doing well until 3:00 pm yesterday when he had some Left sided weakness and fell. He was on the floor when friends got him up to the bed and massaged him to try to make him feel better. This AM he was still unable to stand with assistance so because of concern friends brought him to the hospital. Patient states that he thought he had some slurring of words but does not recall. Denied any N/V/D and denied any CP or SOB. [facility] was called to admit for Stroke Workup and Neurology was consulted for further evaluation.   Assessment & Plan   Acute CVA -Patient presented with left-sided weakness -CT head unremarkable -CTA head and neck is negative for marked large vessel occlusion. -MRI of brain showed acute nonhemorrhagic right basal ganglia infarct. Mild chronic small vessel ischemic disease. -MRA head showed focal high-grade flow limiting stenosis proximal right M2 segment. No emergent large vessel occlusion. -Echocardiogram shows an EF of 65-70%, grade 1 diastolic dysfunction -Hemoglobin A1c 6, LDL 182 -Continue statin, Plavix, aspirin -Neurology consulted and appreciated. -PT/OT recommended CIR- pending insurance approval -discussed with neurology, Dr. Roda Shutters, recommended 30 day event monitor- cardiology made aware  Right Upper extremity - limited ROM -Per physical therapy, suspect patient may have rotator cuff injury.  -MRI showed: Complete  fatty replacement of all the rotator cuff musculature without tendon tear. Edema in the imaged trapezius is consistent with early denervation atrophy change. Moderately severe atrophy of the deltoid is also present  -patient may want to follow up with orthopedics as an outpatient  Accelerated hypertension -Allowing for permissive hypertension given the acute CVA  Hyponatremia -Resolved, continue to monitor BMP  Hypokalemia -resolved, continue to monitor BMP -magnesium 2.2  Hyperglycemia -Hemoglobin A1c 6 -Continue to monitor closely  DVT Prophylaxis  lovenox   Code Status: Full  Family Communication: Family at bedside  Disposition Plan: Admitted. Pending CIR vs SNF  Consultants Neurology Inpatient Rehab  Procedures  Echocardiogram  Antibiotics   Anti-infectives (From admission, onward)   None      Subjective:   Tim Berger seen and examined today. Patient's son used as a Nurse, learning disability. Currently no complaints. Still unable to move his left side.   Objective:   Vitals:   06/29/17 2216 06/30/17 0112 06/30/17 0517 06/30/17 1055  BP: (!) 162/113 (!) 148/98 (!) 148/109 (!) 150/109  Pulse: 86 76 85 80  Resp: 18 17 18 20   Temp: 98.4 F (36.9 C) 98.1 F (36.7 C) 98.2 F (36.8 C) 98 F (36.7 C)  TempSrc: Oral Oral Oral Oral  SpO2: 100% 100% 97% 99%    Intake/Output Summary (Last 24 hours) at 06/30/2017 1226 Last data filed at 06/30/2017 0845 Gross per 24 hour  Intake -  Output 150 ml  Net -150 ml   There were no vitals filed for this visit.   Exam (no change from previous exam on 06/29/2017)  General: Well developed,  well nourished, NAD, appears stated age  HEENT: NCAT, mucous membranes moist.   Cardiovascular: S1 S2 auscultated, no murmurs, RRR  Respiratory: Clear to auscultation bilaterally with equal chest rise, no wheezing  Abdomen: Soft, nontender, nondistended, + bowel sounds  Extremities: warm dry without cyanosis clubbing or edema  Neuro:  AAOx3, left sided weakness, no new findings  Psych: Appropriate (difficult to truly assess given language barrier)  Data Reviewed: I have personally reviewed following labs and imaging studies  CBC: Recent Labs  Lab 06/27/17 1124 06/27/17 1129 06/28/17 0502  WBC 8.8  --  8.0  NEUTROABS 5.2  --  4.9  HGB 13.3 15.0 13.7  HCT 41.1 44.0 42.1  MCV 67.0*  --  67.5*  PLT 197  --  214   Basic Metabolic Panel: Recent Labs  Lab 06/27/17 1124 06/27/17 1129 06/28/17 0502 06/29/17 0411 06/30/17 0348  NA 131* 138 137 136 138  K 3.4* 3.7 3.2* 3.3* 3.6  CL 99* 100* 101 102 103  CO2 22  --  27 25 26   GLUCOSE 143* 141* 97 103* 102*  BUN 12 15 16 16 17   CREATININE 0.89 0.80 0.89 0.85 1.00  CALCIUM 8.9  --  8.9 9.2 9.4  MG  --   --  2.2  --   --   PHOS  --   --  4.3  --   --    GFR: CrCl cannot be calculated (Unknown ideal weight.). Liver Function Tests: Recent Labs  Lab 06/27/17 1124 06/28/17 0502  AST 36 32  ALT 15* 11*  ALKPHOS 110 115  BILITOT 0.6 0.8  PROT 7.6 7.8  ALBUMIN 4.1 4.2   No results for input(s): LIPASE, AMYLASE in the last 168 hours. No results for input(s): AMMONIA in the last 168 hours. Coagulation Profile: Recent Labs  Lab 06/27/17 1124  INR 1.01   Cardiac Enzymes: No results for input(s): CKTOTAL, CKMB, CKMBINDEX, TROPONINI in the last 168 hours. BNP (last 3 results) No results for input(s): PROBNP in the last 8760 hours. HbA1C: Recent Labs    06/28/17 0502  HGBA1C 6.0*   CBG: Recent Labs  Lab 06/27/17 1127  GLUCAP 133*   Lipid Profile: Recent Labs    06/28/17 0502  CHOL 269*  HDL 58  LDLCALC 182*  TRIG 143  CHOLHDL 4.6   Thyroid Function Tests: No results for input(s): TSH, T4TOTAL, FREET4, T3FREE, THYROIDAB in the last 72 hours. Anemia Panel: No results for input(s): VITAMINB12, FOLATE, FERRITIN, TIBC, IRON, RETICCTPCT in the last 72 hours. Urine analysis: No results found for: COLORURINE, APPEARANCEUR, LABSPEC, PHURINE,  GLUCOSEU, HGBUR, BILIRUBINUR, KETONESUR, PROTEINUR, UROBILINOGEN, NITRITE, LEUKOCYTESUR Sepsis Labs: @LABRCNTIP (procalcitonin:4,lacticidven:4)  )No results found for this or any previous visit (from the past 240 hour(s)).    Radiology Studies: Mr Shoulder Right W Wo Contrast  Result Date: 06/29/2017 CLINICAL DATA:  Patient admitted 06/27/2017 with left-sided weakness and inability to stand. Right shoulder pain. Question septic joint. EXAM: MRI OF THE RIGHT SHOULDER WITHOUT AND WITH CONTRAST TECHNIQUE: Multiplanar, multisequence MR imaging of the right shoulder was performed before and after the administration of intravenous contrast. CONTRAST:  12 ml MULTIHANCE GADOBENATE DIMEGLUMINE 529 MG/ML IV SOLN COMPARISON:  None. FINDINGS: Rotator cuff:  Intact. Muscles: There is severe fatty atrophy of all the rotator cuff muscles. Moderately severe fatty atrophy of the deltoid is also seen. Intermediate increased T2 signal is seen throughout the imaged trapezius. The imaged triceps appears normal. Biceps long head:  Intact. Acromioclavicular Joint: Normal.  Type 1 acromion. No subacromial/subdeltoid bursal fluid. Glenohumeral Joint: Appears normal.  No evidence of infection. Labrum:  Evaluation is limited by motion but no tear is identified. Bones: Normal marrow signal throughout. No evidence of fracture, infection or focal lesion. Other: None. IMPRESSION: Complete fatty replacement of all the rotator cuff musculature without tendon tear. Edema in the imaged trapezius is consistent with early denervation atrophy change. Moderately severe atrophy of the deltoid is also present. Cause for these findings is not identified. Negative for septic joint or other acute abnormality. Electronically Signed   By: Drusilla Kannerhomas  Dalessio M.D.   On: 06/29/2017 09:25     Scheduled Meds: . aspirin  300 mg Rectal Once  . aspirin  300 mg Rectal Daily   Or  . aspirin  325 mg Oral Daily  . atorvastatin  80 mg Oral q1800  .  clopidogrel  75 mg Oral Daily  . enoxaparin (LOVENOX) injection  40 mg Subcutaneous Q24H   Continuous Infusions:    LOS: 2 days   Time Spent in minutes   30 minutes  Keanan Melander D.O. on 06/30/2017 at 12:26 PM  Between 7am to 7pm - Pager - [phone]  After 7pm go to [email or web address] - password [email or web address]  And look for the night coverage person covering for me after hours  [facility] Office  [phone]

## 2017-06-30 NOTE — Progress Notes (Signed)
Patient had an incontinent episode. Removed his underwear and he had several $20 bills. He gave it his friend in the room to keep.

## 2017-07-01 LAB — CARDIOLIPIN ANTIBODIES, IGG, IGM, IGA
Anticardiolipin IgA: <9 APL U/mL (ref 0–11)
Anticardiolipin IgG: <9 GPL U/mL (ref 0–14)
Anticardiolipin IgM: <9 MPL U/mL (ref 0–12)

## 2017-07-01 MED ORDER — ENOXAPARIN SODIUM 40 MG/0.4ML ~~LOC~~ SOLN: 40.0000 mg | SUBCUTANEOUS | Status: DC

## 2017-07-01 MED ORDER — LOSARTAN POTASSIUM 50 MG PO TABS
50.0000 mg | ORAL_TABLET | Freq: Every day | ORAL | Status: DC
  Administered 2017-07-01 – 2017-07-05 (×5): 50 mg via ORAL
  Filled 2017-07-01 (×5): qty 1

## 2017-07-01 NOTE — Progress Notes (Signed)
Informed DentistAmanda Charge RN that I am not qualified to complete the NIH assessment. She informed me that it will be done for me.

## 2017-07-01 NOTE — Plan of Care (Signed)
Patient has remained injury free during this shift.

## 2017-07-01 NOTE — Progress Notes (Signed)
PROGRESS NOTE    Tim Berger   WUJ:[id number]  DOB: 04/10/1966  DOA: 06/27/2017 PCP: Patient, No Pcp Per   Brief Narrative:  Tim Berger is a 51 y.o.[place] patient has never seen a doctor who presented from home with Left Sided Weakness and inability to stand. He is found to have an acute CVA   Subjective:  no change in weakness of left side of body.  ROS: no complaints of nausea, vomiting, constipation diarrhea, cough, dyspnea or dysuria. No other complaints.   Assessment & Plan:   Active Problems: Cerebrovascular accident (CVA) - left arm strength 0/5. Left leg 4/5 -CT head unremarkable -CTA head and neck is negative for marked large vessel occlusion. -MRI of brain showed acute nonhemorrhagic right basal ganglia infarct. Mild chronic small vessel ischemic disease. -MRA head showed focal high-grade flow limiting stenosis proximal right M2 segment. No emergent large vessel occlusion. -Echocardiogram shows an EF of 65-70%, grade 1 diastolic dysfunction -Hemoglobin A1c 6, LDL 182 -Plan:  statin, Plavix, aspirin -PT/OT recommended CIR- pending insurance approval -  Dr. Roda ShuttersXu recommended 30 day event monitor- cardiology made aware    Accelerated hypertension - start Losartan  Right shoulder pain MRI: Complete fatty replacement of all the rotator cuff musculature without tendon tear. Edema in the imaged trapezius is consistent with early denervation atrophy change. Moderately severe atrophy of the deltoid is also present. Cause for these findings is not Identified. - PT to manage    Hyponatremia   Hypokalemia - improved with replacement  Glucose intolerance - A1c is 6    Hyperlipidemia - statin started   DVT prophylaxis: Lovenox Code Status: Full code Family Communication:  Disposition Plan:  Consultants:   neuro Procedures:   2 D ECHO Antimicrobials:  Anti-infectives (From admission, onward)   None        Objective: Vitals:   07/01/17 0058 07/01/17 0605 07/01/17 1021 07/01/17 1503  BP: (!) 132/95 (!) 152/119 (!) 138/95 (!) 142/88  Pulse: 90 93 (!) 104 96  Resp:  20 20 20   Temp:  97.6 F (36.4 C) 98.2 F (36.8 C) 97.9 F (36.6 C)  TempSrc:  Oral Oral Oral  SpO2:  97% 94% 94%   No intake or output data in the 24 hours ending 07/01/17 1740 There were no vitals filed for this visit.  Examination: General exam: Appears comfortable  HEENT: PERRLA, oral mucosa moist, no sclera icterus or thrush Respiratory system: Clear to auscultation. Respiratory effort normal. Cardiovascular system: S1 & S2 heard, RRR.  No murmurs  Gastrointestinal system: Abdomen soft, non-tender, nondistended. Normal bowel sound. No organomegaly Central nervous system: Alert and oriented. Left arm 0/5, left leg 4/5 strength Extremities: No cyanosis, clubbing or edema Skin: No rashes or ulcers Psychiatry:  Mood & affect appropriate.     Data Reviewed: I have personally reviewed following labs and imaging studies  CBC: Recent Labs  Lab 06/27/17 1124 06/27/17 1129 06/28/17 0502  WBC 8.8  --  8.0  NEUTROABS 5.2  --  4.9  HGB 13.3 15.0 13.7  HCT 41.1 44.0 42.1  MCV 67.0*  --  67.5*  PLT 197  --  214   Basic Metabolic Panel: Recent Labs  Lab 06/27/17 1124 06/27/17 1129 06/28/17 0502 06/29/17 0411 06/30/17 0348  NA 131* 138 137 136 138  K 3.4* 3.7 3.2* 3.3* 3.6  CL 99* 100* 101 102 103  CO2 22  --  27 25 26   GLUCOSE 143* 141* 97 103*  102*  BUN 12 15 16 16 17   CREATININE 0.89 0.80 0.89 0.85 1.00  CALCIUM 8.9  --  8.9 9.2 9.4  MG  --   --  2.2  --   --   PHOS  --   --  4.3  --   --    GFR: CrCl cannot be calculated (Unknown ideal weight.). Liver Function Tests: Recent Labs  Lab 06/27/17 1124 06/28/17 0502  AST 36 32  ALT 15* 11*  ALKPHOS 110 115  BILITOT 0.6 0.8  PROT 7.6 7.8  ALBUMIN 4.1 4.2   No results for input(s): LIPASE, AMYLASE in the last 168 hours. No results for  input(s): AMMONIA in the last 168 hours. Coagulation Profile: Recent Labs  Lab 06/27/17 1124  INR 1.01   Cardiac Enzymes: No results for input(s): CKTOTAL, CKMB, CKMBINDEX, TROPONINI in the last 168 hours. BNP (last 3 results) No results for input(s): PROBNP in the last 8760 hours. HbA1C: No results for input(s): HGBA1C in the last 72 hours. CBG: Recent Labs  Lab 06/27/17 1127  GLUCAP 133*   Lipid Profile: No results for input(s): CHOL, HDL, LDLCALC, TRIG, CHOLHDL, LDLDIRECT in the last 72 hours. Thyroid Function Tests: No results for input(s): TSH, T4TOTAL, FREET4, T3FREE, THYROIDAB in the last 72 hours. Anemia Panel: No results for input(s): VITAMINB12, FOLATE, FERRITIN, TIBC, IRON, RETICCTPCT in the last 72 hours. Urine analysis: No results found for: COLORURINE, APPEARANCEUR, LABSPEC, PHURINE, GLUCOSEU, HGBUR, BILIRUBINUR, KETONESUR, PROTEINUR, UROBILINOGEN, NITRITE, LEUKOCYTESUR Sepsis Labs: @LABRCNTIP (procalcitonin:4,lacticidven:4) )No results found for this or any previous visit (from the past 240 hour(s)).       Radiology Studies: No results found.    Scheduled Meds: . aspirin  300 mg Rectal Once  . aspirin  300 mg Rectal Daily   Or  . aspirin  325 mg Oral Daily  . atorvastatin  80 mg Oral q1800  . clopidogrel  75 mg Oral Daily  . enoxaparin (LOVENOX) injection  40 mg Subcutaneous Q24H   Continuous Infusions:   LOS: 3 days    Time spent in minutes: 35    Calvert CantorSaima Kelsey Durflinger, MD [facility] Pager: [email or web address] Password [email or web address] 07/01/2017, 5:40 PM

## 2017-07-02 DIAGNOSIS — I10 Essential (primary) hypertension: Secondary | ICD-10-CM

## 2017-07-02 DIAGNOSIS — E7439 Other disorders of intestinal carbohydrate absorption: Secondary | ICD-10-CM

## 2017-07-02 LAB — BETA-2-GLYCOPROTEIN I ABS, IGG/M/A

## 2017-07-02 MED ORDER — ASPIRIN 325 MG PO TABS: 325.0000 mg | ORAL_TABLET | Freq: Every day | ORAL | 0 refills | Status: DC

## 2017-07-02 MED ORDER — LOSARTAN POTASSIUM 50 MG PO TABS: 50.0000 mg | ORAL_TABLET | Freq: Every day | ORAL | 0 refills | Status: DC

## 2017-07-02 MED ORDER — ATORVASTATIN CALCIUM 80 MG PO TABS: 80.0000 mg | ORAL_TABLET | Freq: Every day | ORAL | 0 refills | Status: DC

## 2017-07-02 MED ORDER — CLOPIDOGREL BISULFATE 75 MG PO TABS: 75.0000 mg | ORAL_TABLET | Freq: Every day | ORAL | 0 refills | Status: DC

## 2017-07-02 NOTE — NC FL2 (Signed)
[place] MEDICAID FL2 LEVEL OF CARE SCREENING TOOL     IDENTIFICATION  Patient Name: Tim Berger Birthdate: 08/07/1965 Sex: male Admission Date (Current Location): 06/27/2017  [facility] [place]   [facility] and Address[facility]  [facility] [address], [place], [place] [address]      Provider Number: [id number]  Attending Physician Name and Address:  Calvert Cantorizwan, Saima, MD  Relative Name and Phone Number:  Tim Berger, niece, [phone]    Current Level of Care: Hospital Recommended Level of Care: Skilled Nursing Facility Prior Approval Number:    Date Approved/Denied:   PASRR Number: [id number]  Discharge Plan: SNF    Current Diagnoses: Patient Active Problem List   Diagnosis Date Noted  . CVA (cerebral vascular accident) (HCC) 06/28/2017  . Hyperlipidemia   . Suspected cerebrovascular accident (CVA) 06/27/2017  . Accelerated hypertension 06/27/2017  . Hyponatremia 06/27/2017  . Hypokalemia 06/27/2017  . Hyperglycemia 06/27/2017    Orientation RESPIRATION BLADDER Height & Weight     Time, Situation, Place, Self  Normal Incontinent, External catheter Weight:   Height:     BEHAVIORAL SYMPTOMS/MOOD NEUROLOGICAL BOWEL NUTRITION STATUS      Continent Diet(Please see DC Summary)  AMBULATORY STATUS COMMUNICATION OF NEEDS Skin   Extensive Assist Verbally Normal                       Personal Care Assistance Level of Assistance  Bathing, Feeding, Dressing Bathing Assistance: Maximum assistance Feeding assistance: Limited assistance Dressing Assistance: Maximum assistance     Functional Limitations Info             SPECIAL CARE FACTORS FREQUENCY  PT (By licensed PT), OT (By licensed OT), Speech therapy     PT Frequency: 5x/week OT Frequency: 3x/week     Speech Therapy Frequency: 3x/week      Contractures      Additional Factors Info  Code Status, Allergies Code Status Info: Full Allergies Info: NKA            Current Medications (07/02/2017):  This is the current hospital active medication list Current Facility-Administered Medications  Medication Dose Route Frequency Provider Last Rate Last Dose  . acetaminophen (TYLENOL) tablet 650 mg  650 mg Oral Q4H PRN Marguerita MerlesSheikh, Omair [place]villeLatif, DO   650 mg at 07/01/17 1945   Or  . acetaminophen (TYLENOL) solution 650 mg  650 mg Per Tube Q4H PRN Marguerita MerlesSheikh, Omair Latif, DO       Or  . acetaminophen (TYLENOL) suppository 650 mg  650 mg Rectal Q4H PRN Marguerita MerlesSheikh, Omair Latif, DO      . aspirin suppository 300 mg  300 mg Rectal Once Caryl PinaLindzen, Eric, MD      . aspirin suppository 300 mg  300 mg Rectal Daily Marguerita MerlesSheikh, Omair Latif, DO       Or  . aspirin tablet 325 mg  325 mg Oral Daily Marguerita MerlesSheikh, Omair [place]Latif, DO   325 mg at 07/01/17 0940  . atorvastatin (LIPITOR) tablet 80 mg  80 mg Oral q1800 Arlice Coltostello, Mary A, NP   80 mg at 07/01/17 1707  . clopidogrel (PLAVIX) tablet 75 mg  75 mg Oral Daily Marvel PlanXu, Jindong, MD   75 mg at 07/01/17 0940  . enoxaparin (LOVENOX) injection 40 mg  40 mg Subcutaneous Q24H Marguerita MerlesSheikh, Omair DickensLatif, DO   40 mg at 07/01/17 2130  . hydrALAZINE (APRESOLINE) injection 10 mg  10 mg Intravenous Q6H PRN Marguerita MerlesSheikh, Omair [place]if, DO  10 mg at 07/01/17 0657  . losartan (COZAAR) tablet 50 mg  50 mg Oral Daily Calvert Cantorizwan, Saima, MD   50 mg at 07/01/17 1945  . senna-docusate (Senokot-S) tablet 1 tablet  1 tablet Oral QHS PRN Marguerita MerlesSheikh, Omair [place]if, DO   1 tablet at 07/01/17 [id number]     Discharge Medications: Please see discharge summary for a list of discharge medications.  Relevant Imaging Results:  Relevant Lab Results:   Additional Information SSN: [id number]  Natasia Sanko S PhoenixRayyan, [place]CSWA

## 2017-07-02 NOTE — Discharge Summary (Addendum)
Physician Discharge Summary  Tim Berger:[id number] DOB: 04-14-1966 DOA: 06/27/2017  PCP: Patient, No Pcp Per  Admit date: 06/27/2017 Discharge date: 07/05/2017  Admitted From: home Disposition:  CIR   Recommendations for Outpatient Follow-up:  1. F/u on 30 day event monitor 2. Needs a diabetic diet and weight loss 3. Needs outpt w/u of Tear drop cells noted on blood smear  Discharge Condition:  stable   CODE STATUS:  Full code   Consultations:  neurology    Discharge Diagnoses:  Principal Problem:   CVA (cerebral vascular accident) (HCC) Active Problems:   Accelerated hypertension   Hyponatremia   Hypokalemia   Hyperlipidemia   Glucose intolerance   Benign essential HTN    Subjective: No change in weakness of left side of body.     Brief Summary: Tim Berger is a 51 y.o[place] patient [place] seen a doctor who presented from home with acute onset left Sided Weakness and inability to stand. He is found to have an acute CVA  Hospital Course:  Active Problems: Cerebrovascular accident - Acute nonhemorrhagic RIGHT basal ganglia infarct - left arm strength 0/5. Left leg 4/5 -CT head unremarkable -CTA head and neck is negative for marked large vessel occlusion. -MRI of brain showed acute nonhemorrhagic right basal ganglia infarct. Mild chronic small vessel ischemic disease. -MRA head showed focal high-grade flow limiting stenosis proximal right M2 segment. No emergent large vessel occlusion. -Echocardiogram shows an EF of 65-70%, grade 1 diastolic dysfunction -Hemoglobin A1c 6 - LDL 182 -Plan:  statin, Plavix, aspirin- continue for 3 months and then Plavix alone indefinitely  -PT/OT recommended CIR  -  Dr. Roda Shutters recommended 30 day event monitor- cardiology made aware  Hypertension - initial BP 160s- 180s /100s- steadily improved to SBP in 130- 140s spontaneously - has improved to normal after starting  Losartan  Right shoulder pain MRI: Complete fatty replacement of all the rotator cuff musculature without tendon tear. Edema in the imaged trapezius is consistent with early denervation atrophy change. Moderately severe atrophy of the deltoid is also present. Cause for these findings is not Identified. - PT to manage-     Hyponatremia   Hypokalemia - improved with replacement  Glucose intolerance - A1c is 6 - diet control- needs weight loss    Hyperlipidemia - statin started   Teardrop cells on peripheral blood smear - can be suggestive of a Thalassemia trait- outpt work up with hematology referral     Discharge Instructions  Discharge Instructions    Ambulatory referral to Neurology   Complete by:  As directed    Pt will follow up with stroke MD Roda Shutters preferred, if not available, then consider Pearlean Brownie, Penumalli or Ahern) at [facility] in about 2 months. Thanks.   Diet - low sodium heart healthy   Complete by:  As directed    Diet Carb Modified   Complete by:  As directed    Increase activity slowly   Complete by:  As directed      Allergies as of 07/05/2017   No Known Allergies     Medication List    TAKE these medications   aspirin 325 MG tablet Take 1 tablet (325 mg total) by mouth daily.   atorvastatin 80 MG tablet Commonly known as:  LIPITOR Take 1 tablet (80 mg total) by mouth daily at 6 PM.   clopidogrel 75 MG tablet Commonly known as:  PLAVIX Take 1 tablet (75 mg total) by mouth daily.  losartan 50 MG tablet Commonly known as:  COZAAR Take 1 tablet (50 mg total) by mouth daily.       Contact information for follow-up providers    Marvel PlanXu, Jindong, MD. Schedule an appointment as soon as possible for a visit in 6 week(s).   Specialty:  Neurology Contact information: [address] [place] [phone]            Contact information for after-discharge care    Destination    [facility] .   Service:   Skilled Nursing Contact information: [address] [place] [id number] [phone]                 No Known Allergies   Procedures/Studies:   2 D ECHO Left ventricle: The cavity size was normal. There was moderate   focal basal hypertrophy of the septum with otherwise mild   concetric hypertrophy. Systolic function was vigorous. The   estimated ejection fraction was in the range of 65% to 70%. Wall   motion was normal; there were no regional wall motion   abnormalities. Doppler parameters are consistent with abnormal   left ventricular relaxation (grade 1 diastolic dysfunction).   Doppler parameters are consistent with high ventricular filling   pressure. - Aortic valve: Transvalvular velocity was within the normal range.   There was no stenosis. There was no regurgitation. - Ascending aorta: The ascending aorta was mildly dilated. - Mitral valve: Transvalvular velocity was within the normal range.   There was no evidence for stenosis. There was no regurgitation. - Left atrium: The atrium was moderately dilated. - Right ventricle: The cavity size was normal. Wall thickness was   normal. Systolic function was normal. - Atrial septum: A patent foramen ovale cannot be excluded. - Tricuspid valve: There was no regurgitation.       Ct Angio Head W Or Wo Contrast  Result Date: 06/27/2017 CLINICAL DATA:  51 year old male code stroke. Left side weakness and abnormal speech. Last seen normal 1300 hours yesterday. EXAM: CT ANGIOGRAPHY HEAD AND NECK CT PERFUSION BRAIN TECHNIQUE: Multidetector CT imaging of the head and neck was performed using the standard protocol during bolus administration of intravenous contrast. Multiplanar CT image reconstructions and MIPs were obtained to evaluate the vascular anatomy. Carotid stenosis measurements (when applicable) are obtained utilizing NASCET criteria, using the distal internal carotid diameter as the denominator.  Multiphase CT imaging of the brain was performed following IV bolus contrast injection. Subsequent parametric perfusion maps were calculated using RAPID software. CONTRAST:  90mL ISOVUE-370 IOPAMIDOL (ISOVUE-370) INJECTION 76% COMPARISON:  CT head without contrast 1131 hours today. FINDINGS: CT Brain Perfusion Findings: CBF (<30%) Volume: 0 mL Perfusion (Tmax>6.0s) volume: 0 mL Mismatch Volume: Not applicablemL Infarction Location: Not applicable CTA NECK Skeleton: Intermittent poor dentition. No acute osseous abnormality identified. Mild paranasal sinus mucosal thickening, primarily in the ethmoids. Upper chest: Negative lung apices aside from mild dependent atelectasis. Negative visualized superior mediastinum. Other neck: Negative.  No cervical lymphadenopathy. Aortic arch: 3 vessel arch configuration. No significant arch atherosclerosis. Right carotid system: Unusual small innominate arterial branch off of the brachiocephalic artery (series 7, image 146), normal variant. Normal right CCA origin. Negative right CCA, right carotid bifurcation, and cervical right ICA. Left carotid system: Normal left CCA origin. Negative left CCA, left carotid bifurcation, and cervical left ICA. Vertebral arteries: Normal proximal right subclavian artery. Normal right vertebral artery origin on series 10, image 151. Tortuous right V1 segment.  Normal right vertebral artery to the skullbase. Mild circumferential soft plaque at the proximal left subclavian artery without stenosis. Evidence of soft plaque at the left vert T bull artery origin with up to moderate stenosis (series 10, image 149). The vertebral arteries are fairly codominant. The left is otherwise negative to the skullbase. CTA HEAD Posterior circulation: Tortuous distal vertebral arteries without atherosclerosis or stenosis. Normal left PICA origin. The right AICA appears dominant. The left V4 segment is somewhat diminutive beyond the left PICA. Patent vertebrobasilar  junction without stenosis. Patent AICA origins. Patent basilar artery without stenosis. Patent SCA origins. Normal left PCA origin. Fetal right PCA origin. Left posterior communicating artery diminutive or absent. Bilateral PCA branches are within normal limits. Anterior circulation: Both ICA siphons are patent. No siphon plaque or stenosis identified. Normal ophthalmic artery origins. Normal right posterior communicating artery origin. Patent carotid termini. Normal MCA origins. The left ACA A1 is dominant and the right is diminutive or absent. The anterior communicating artery and bilateral ACA branches are within normal limits. The proximal left M1 segment is tortuous and perhaps mildly irregular, but there is no associated stenosis. The left MCA bifurcation is patent. Left MCA branches are within normal limits. The right MCA M1 segment is patent without stenosis. The right MCA bifurcation is patent. The right MCA branches appear within normal limits. No right MCA branch stenosis or occlusion is identified. Venous sinuses: Patent. Anatomic variants: Mildly dominant distal right vertebral artery. Fetal right PCA origin. Dominant left and diminutive or absent right ACA A1 segments. Small innominate arterial branch off of the proximal brachiocephalic artery in the superior mediastinum. Review of the MIP images confirms the above findings IMPRESSION: 1. Negative for emergent large vessel occlusion. No core infarct or penumbra detected by CT Perfusion. Right MCA branches appear within normal limits. This information was discussed by telephone with Dr. Caryl Pina on 06/27/2017 at noon. 2. Minimal extracranial atherosclerosis, primarily at the proximal left subclavian artery where there does appear to be mild or moderate stenosis at the Left Vertebral Artery origin. 3. Minimal intracranial atherosclerosis, primarily at the Left MCA M1 segment, with no intracranial stenosis. Electronically Signed   By: Odessa Fleming M.D.    On: 06/27/2017 12:13   Dg Chest 2 View  Result Date: 06/27/2017 CLINICAL DATA:  51 year old male with a history of stroke EXAM: CHEST  2 VIEW COMPARISON:  None. FINDINGS: The heart size and mediastinal contours are within normal limits. Both lungs are clear. The visualized skeletal structures are unremarkable. IMPRESSION: Negative for acute cardiopulmonary disease. Electronically Signed   By: Gilmer Mor D.O.   On: 06/27/2017 19:48   Ct Angio Neck W Or Wo Contrast  Result Date: 06/27/2017 CLINICAL DATA:  51 year old male code stroke. Left side weakness and abnormal speech. Last seen normal 1300 hours yesterday. EXAM: CT ANGIOGRAPHY HEAD AND NECK CT PERFUSION BRAIN TECHNIQUE: Multidetector CT imaging of the head and neck was performed using the standard protocol during bolus administration of intravenous contrast. Multiplanar CT image reconstructions and MIPs were obtained to evaluate the vascular anatomy. Carotid stenosis measurements (when applicable) are obtained utilizing NASCET criteria, using the distal internal carotid diameter as the denominator. Multiphase CT imaging of the brain was performed following IV bolus contrast injection. Subsequent parametric perfusion maps were calculated using RAPID software. CONTRAST:  90mL ISOVUE-370 IOPAMIDOL (ISOVUE-370) INJECTION 76% COMPARISON:  CT head without contrast 1131 hours today. FINDINGS: CT Brain Perfusion Findings: CBF (<30%) Volume: 0 mL Perfusion (Tmax>6.0s) volume: 0  mL Mismatch Volume: Not applicablemL Infarction Location: Not applicable CTA NECK Skeleton: Intermittent poor dentition. No acute osseous abnormality identified. Mild paranasal sinus mucosal thickening, primarily in the ethmoids. Upper chest: Negative lung apices aside from mild dependent atelectasis. Negative visualized superior mediastinum. Other neck: Negative.  No cervical lymphadenopathy. Aortic arch: 3 vessel arch configuration. No significant arch atherosclerosis. Right carotid  system: Unusual small innominate arterial branch off of the brachiocephalic artery (series 7, image 146), normal variant. Normal right CCA origin. Negative right CCA, right carotid bifurcation, and cervical right ICA. Left carotid system: Normal left CCA origin. Negative left CCA, left carotid bifurcation, and cervical left ICA. Vertebral arteries: Normal proximal right subclavian artery. Normal right vertebral artery origin on series 10, image 151. Tortuous right V1 segment. Normal right vertebral artery to the skullbase. Mild circumferential soft plaque at the proximal left subclavian artery without stenosis. Evidence of soft plaque at the left vert T bull artery origin with up to moderate stenosis (series 10, image 149). The vertebral arteries are fairly codominant. The left is otherwise negative to the skullbase. CTA HEAD Posterior circulation: Tortuous distal vertebral arteries without atherosclerosis or stenosis. Normal left PICA origin. The right AICA appears dominant. The left V4 segment is somewhat diminutive beyond the left PICA. Patent vertebrobasilar junction without stenosis. Patent AICA origins. Patent basilar artery without stenosis. Patent SCA origins. Normal left PCA origin. Fetal right PCA origin. Left posterior communicating artery diminutive or absent. Bilateral PCA branches are within normal limits. Anterior circulation: Both ICA siphons are patent. No siphon plaque or stenosis identified. Normal ophthalmic artery origins. Normal right posterior communicating artery origin. Patent carotid termini. Normal MCA origins. The left ACA A1 is dominant and the right is diminutive or absent. The anterior communicating artery and bilateral ACA branches are within normal limits. The proximal left M1 segment is tortuous and perhaps mildly irregular, but there is no associated stenosis. The left MCA bifurcation is patent. Left MCA branches are within normal limits. The right MCA M1 segment is patent without  stenosis. The right MCA bifurcation is patent. The right MCA branches appear within normal limits. No right MCA branch stenosis or occlusion is identified. Venous sinuses: Patent. Anatomic variants: Mildly dominant distal right vertebral artery. Fetal right PCA origin. Dominant left and diminutive or absent right ACA A1 segments. Small innominate arterial branch off of the proximal brachiocephalic artery in the superior mediastinum. Review of the MIP images confirms the above findings IMPRESSION: 1. Negative for emergent large vessel occlusion. No core infarct or penumbra detected by CT Perfusion. Right MCA branches appear within normal limits. This information was discussed by telephone with Dr. Caryl Pina on 06/27/2017 at noon. 2. Minimal extracranial atherosclerosis, primarily at the proximal left subclavian artery where there does appear to be mild or moderate stenosis at the Left Vertebral Artery origin. 3. Minimal intracranial atherosclerosis, primarily at the Left MCA M1 segment, with no intracranial stenosis. Electronically Signed   By: Odessa Fleming M.D.   On: 06/27/2017 12:13   Mr Brain Wo Contrast  Result Date: 06/27/2017 CLINICAL DATA:  Acute onset LEFT-sided weakness and speech disturbance after nap yesterday. Follow-up code stroke. History of hypertension. EXAM: MRI HEAD WITHOUT CONTRAST MRA HEAD WITHOUT CONTRAST TECHNIQUE: Multiplanar, multiecho pulse sequences of the brain and surrounding structures were obtained without intravenous contrast. Angiographic images of the head were obtained using MRA technique without contrast. COMPARISON:  CT HEAD June 27, 2017 at 1131 hours and CTA HEAD June 27, 2017 at 1148 hours  FINDINGS: MRI HEAD FINDINGS BRAIN: Confluent reduced diffusion RIGHT basal ganglia measuring to 4 x 1.7 cm with low ADC values. No susceptibility artifact to suggest hemorrhage. 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 Ventricles and sulci are normal for  patient's age. No midline shift, mass effect or masses. A few scattered subcentimeter supratentorial white matter FLAIR T2 hyperintensities compatible with mild chronic small vessel ischemic disease. No abnormal extra-axial fluid collections. VASCULAR: Normal major intracranial vascular flow voids present at skull base. SKULL AND UPPER CERVICAL SPINE: No abnormal sellar expansion. No suspicious calvarial bone marrow signal. Craniocervical junction maintained. SINUSES/ORBITS: The mastoid air-cells and included paranasal sinuses are well-aerated. The included ocular globes and orbital contents are non-suspicious. OTHER: None. MRA HEAD FINDINGS- mildly motion degraded examination. ANTERIOR CIRCULATION: Normal flow related enhancement of the included cervical, petrous, cavernous and supraclinoid internal carotid arteries. Patent anterior communicating artery. Proximal severe flow limiting stenosis RIGHT M2 segment and thready distal flow. Otherwise patent anterior and middle cerebral arteries, including distal segments. No large vessel occlusion, flow limiting stenosis, aneurysm. POSTERIOR CIRCULATION: Codominant vertebral artery's. Basilar artery is patent, with normal flow related enhancement of the main branch vessels. Patent posterior cerebral arteries. Fetal origin RIGHT posterior cerebral artery. No large vessel occlusion, flow limiting stenosis,  aneurysm. ANATOMIC VARIANTS: Aplastic RIGHT A1 segment. Source images and MIP images were reviewed. IMPRESSION: MRI HEAD: 1. Acute nonhemorrhagic RIGHT basal ganglia infarct. 2. Mild chronic small vessel ischemic disease. MRA HEAD: 1. Focal high-grade flow limiting stenosis proximal RIGHT M2 segment. No emergent large vessel occlusion. Acute findings discussed with and reconfirmed by Dr.AROOR, Neurology on 06/27/2017 at 8:40 pm. Electronically Signed   By: Awilda Metro M.D.   On: 06/27/2017 20:45   Mr Shoulder Right W Wo Contrast  Result Date:  06/29/2017 CLINICAL DATA:  Patient admitted 06/27/2017 with left-sided weakness and inability to stand. Right shoulder pain. Question septic joint. EXAM: MRI OF THE RIGHT SHOULDER WITHOUT AND WITH CONTRAST TECHNIQUE: Multiplanar, multisequence MR imaging of the right shoulder was performed before and after the administration of intravenous contrast. CONTRAST:  12 ml MULTIHANCE GADOBENATE DIMEGLUMINE 529 MG/ML IV SOLN COMPARISON:  None. FINDINGS: Rotator cuff:  Intact. Muscles: There is severe fatty atrophy of all the rotator cuff muscles. Moderately severe fatty atrophy of the deltoid is also seen. Intermediate increased T2 signal is seen throughout the imaged trapezius. The imaged triceps appears normal. Biceps long head:  Intact. Acromioclavicular Joint: Normal. Type 1 acromion. No subacromial/subdeltoid bursal fluid. Glenohumeral Joint: Appears normal.  No evidence of infection. Labrum:  Evaluation is limited by motion but no tear is identified. Bones: Normal marrow signal throughout. No evidence of fracture, infection or focal lesion. Other: None. IMPRESSION: Complete fatty replacement of all the rotator cuff musculature without tendon tear. Edema in the imaged trapezius is consistent with early denervation atrophy change. Moderately severe atrophy of the deltoid is also present. Cause for these findings is not identified. Negative for septic joint or other acute abnormality. Electronically Signed   By: Drusilla Kanner M.D.   On: 06/29/2017 09:25   Ct Cerebral Perfusion W Contrast  Result Date: 06/27/2017 CLINICAL DATA:  51 year old male code stroke. Left side weakness and abnormal speech. Last seen normal 1300 hours yesterday. EXAM: CT ANGIOGRAPHY HEAD AND NECK CT PERFUSION BRAIN TECHNIQUE: Multidetector CT imaging of the head and neck was performed using the standard protocol during bolus administration of intravenous contrast. Multiplanar CT image reconstructions and MIPs were obtained to evaluate the  vascular anatomy. Carotid  stenosis measurements (when applicable) are obtained utilizing NASCET criteria, using the distal internal carotid diameter as the denominator. Multiphase CT imaging of the brain was performed following IV bolus contrast injection. Subsequent parametric perfusion maps were calculated using RAPID software. CONTRAST:  90mL ISOVUE-370 IOPAMIDOL (ISOVUE-370) INJECTION 76% COMPARISON:  CT head without contrast 1131 hours today. FINDINGS: CT Brain Perfusion Findings: CBF (<30%) Volume: 0 mL Perfusion (Tmax>6.0s) volume: 0 mL Mismatch Volume: Not applicablemL Infarction Location: Not applicable CTA NECK Skeleton: Intermittent poor dentition. No acute osseous abnormality identified. Mild paranasal sinus mucosal thickening, primarily in the ethmoids. Upper chest: Negative lung apices aside from mild dependent atelectasis. Negative visualized superior mediastinum. Other neck: Negative.  No cervical lymphadenopathy. Aortic arch: 3 vessel arch configuration. No significant arch atherosclerosis. Right carotid system: Unusual small innominate arterial branch off of the brachiocephalic artery (series 7, image 146), normal variant. Normal right CCA origin. Negative right CCA, right carotid bifurcation, and cervical right ICA. Left carotid system: Normal left CCA origin. Negative left CCA, left carotid bifurcation, and cervical left ICA. Vertebral arteries: Normal proximal right subclavian artery. Normal right vertebral artery origin on series 10, image 151. Tortuous right V1 segment. Normal right vertebral artery to the skullbase. Mild circumferential soft plaque at the proximal left subclavian artery without stenosis. Evidence of soft plaque at the left vert T bull artery origin with up to moderate stenosis (series 10, image 149). The vertebral arteries are fairly codominant. The left is otherwise negative to the skullbase. CTA HEAD Posterior circulation: Tortuous distal vertebral arteries without  atherosclerosis or stenosis. Normal left PICA origin. The right AICA appears dominant. The left V4 segment is somewhat diminutive beyond the left PICA. Patent vertebrobasilar junction without stenosis. Patent AICA origins. Patent basilar artery without stenosis. Patent SCA origins. Normal left PCA origin. Fetal right PCA origin. Left posterior communicating artery diminutive or absent. Bilateral PCA branches are within normal limits. Anterior circulation: Both ICA siphons are patent. No siphon plaque or stenosis identified. Normal ophthalmic artery origins. Normal right posterior communicating artery origin. Patent carotid termini. Normal MCA origins. The left ACA A1 is dominant and the right is diminutive or absent. The anterior communicating artery and bilateral ACA branches are within normal limits. The proximal left M1 segment is tortuous and perhaps mildly irregular, but there is no associated stenosis. The left MCA bifurcation is patent. Left MCA branches are within normal limits. The right MCA M1 segment is patent without stenosis. The right MCA bifurcation is patent. The right MCA branches appear within normal limits. No right MCA branch stenosis or occlusion is identified. Venous sinuses: Patent. Anatomic variants: Mildly dominant distal right vertebral artery. Fetal right PCA origin. Dominant left and diminutive or absent right ACA A1 segments. Small innominate arterial branch off of the proximal brachiocephalic artery in the superior mediastinum. Review of the MIP images confirms the above findings IMPRESSION: 1. Negative for emergent large vessel occlusion. No core infarct or penumbra detected by CT Perfusion. Right MCA branches appear within normal limits. This information was discussed by telephone with Dr. Caryl Pina on 06/27/2017 at noon. 2. Minimal extracranial atherosclerosis, primarily at the proximal left subclavian artery where there does appear to be mild or moderate stenosis at the Left  Vertebral Artery origin. 3. Minimal intracranial atherosclerosis, primarily at the Left MCA M1 segment, with no intracranial stenosis. Electronically Signed   By: Odessa Fleming M.D.   On: 06/27/2017 12:13   Mr Maxine Glenn Head Wo Contrast  Result Date: 06/27/2017 CLINICAL DATA:  Acute onset LEFT-sided weakness and speech disturbance after nap yesterday. Follow-up code stroke. History of hypertension. EXAM: MRI HEAD WITHOUT CONTRAST MRA HEAD WITHOUT CONTRAST TECHNIQUE: Multiplanar, multiecho pulse sequences of the brain and surrounding structures were obtained without intravenous contrast. Angiographic images of the head were obtained using MRA technique without contrast. COMPARISON:  CT HEAD June 27, 2017 at 1131 hours and CTA HEAD June 27, 2017 at 1148 hours FINDINGS: MRI HEAD FINDINGS BRAIN: Confluent reduced diffusion RIGHT basal ganglia measuring to 4 x 1.7 cm with low ADC values. No susceptibility artifact to suggest hemorrhage. 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 Ventricles and sulci are normal for patient's age. No midline shift, mass effect or masses. A few scattered subcentimeter supratentorial white matter FLAIR T2 hyperintensities compatible with mild chronic small vessel ischemic disease. No abnormal extra-axial fluid collections. VASCULAR: Normal major intracranial vascular flow voids present at skull base. SKULL AND UPPER CERVICAL SPINE: No abnormal sellar expansion. No suspicious calvarial bone marrow signal. Craniocervical junction maintained. SINUSES/ORBITS: The mastoid air-cells and included paranasal sinuses are well-aerated. The included ocular globes and orbital contents are non-suspicious. OTHER: None. MRA HEAD FINDINGS- mildly motion degraded examination. ANTERIOR CIRCULATION: Normal flow related enhancement of the included cervical, petrous, cavernous and supraclinoid internal carotid arteries. Patent anterior communicating artery. Proximal severe flow limiting  stenosis RIGHT M2 segment and thready distal flow. Otherwise patent anterior and middle cerebral arteries, including distal segments. No large vessel occlusion, flow limiting stenosis, aneurysm. POSTERIOR CIRCULATION: Codominant vertebral artery's. Basilar artery is patent, with normal flow related enhancement of the main branch vessels. Patent posterior cerebral arteries. Fetal origin RIGHT posterior cerebral artery. No large vessel occlusion, flow limiting stenosis,  aneurysm. ANATOMIC VARIANTS: Aplastic RIGHT A1 segment. Source images and MIP images were reviewed. IMPRESSION: MRI HEAD: 1. Acute nonhemorrhagic RIGHT basal ganglia infarct. 2. Mild chronic small vessel ischemic disease. MRA HEAD: 1. Focal high-grade flow limiting stenosis proximal RIGHT M2 segment. No emergent large vessel occlusion. Acute findings discussed with and reconfirmed by Dr.AROOR, Neurology on 06/27/2017 at 8:40 pm. Electronically Signed   By: Awilda Metro M.D.   On: 06/27/2017 20:45   Ct Head Code Stroke Wo Contrast  Result Date: 06/27/2017 CLINICAL DATA:  Code stroke. Left-sided weakness. Last seen normal yesterday. EXAM: CT HEAD WITHOUT CONTRAST TECHNIQUE: Contiguous axial images were obtained from the base of the skull through the vertex without intravenous contrast. COMPARISON:  None. FINDINGS: Brain: Normal appearance without evidence of malformation, atrophy, old or acute infarction, mass lesion, hemorrhage, hydrocephalus or extra-axial collection. Vascular: No abnormal vascular finding. Skull: Normal Sinuses/Orbits: Clear/normal Other: None ASPECTS (Alberta Stroke Program Early CT Score) - Ganglionic level infarction (caudate, lentiform nuclei, internal capsule, insula, M1-M3 cortex): 7 - Supraganglionic infarction (M4-M6 cortex): 3 Total score (0-10 with 10 being normal): 10 IMPRESSION: 1. Normal head CT 2. ASPECTS is 10. These results were communicated to Dr. Otelia Limes at 11:36 amon 12/19/2018by text page via the [facility]  messaging system. Electronically Signed   By: Paulina Fusi M.D.   On: 06/27/2017 11:38       Discharge Exam: Vitals:   07/05/17 0400 07/05/17 0948  BP: (!) 147/88 122/89  Pulse: 85 71  Resp: 18 18  Temp: 97.9 F (36.6 C) 97.8 F (36.6 C)  SpO2: 97% 97%   Vitals:   07/04/17 2100 07/05/17 0100 07/05/17 0400 07/05/17 0948  BP: 134/90 122/89 (!) 147/88 122/89  Pulse: 74 64 85 71  Resp: 18 18 18 18   Temp: 98 F (  36.7 C) 97.6 F (36.4 C) 97.9 F (36.6 C) 97.8 F (36.6 C)  TempSrc: Oral Axillary Oral Oral  SpO2: 99% 97% 97% 97%    General: Pt is alert, awake, not in acute distress Cardiovascular: RRR, S1/S2 +, no rubs, no gallops Respiratory: CTA bilaterally, no wheezing, no rhonchi Abdominal: Soft, NT, ND, bowel sounds + Extremities: no edema, no cyanosis    The results of significant diagnostics from this hospitalization (including imaging, microbiology, ancillary and laboratory) are listed below for reference.     Microbiology: No results found for this or any previous visit (from the past 240 hour(s)).   Labs: BNP (last 3 results) No results for input(s): BNP in the last 8760 hours. Basic Metabolic Panel: Recent Labs  Lab 06/29/17 0411 06/30/17 0348 07/04/17 0513  NA 136 138  --   K 3.3* 3.6  --   CL 102 103  --   CO2 25 26  --   GLUCOSE 103* 102*  --   BUN 16 17  --   CREATININE 0.85 1.00 0.96  CALCIUM 9.2 9.4  --    Liver Function Tests: No results for input(s): AST, ALT, ALKPHOS, BILITOT, PROT, ALBUMIN in the last 168 hours. No results for input(s): LIPASE, AMYLASE in the last 168 hours. No results for input(s): AMMONIA in the last 168 hours. CBC: No results for input(s): WBC, NEUTROABS, HGB, HCT, MCV, PLT in the last 168 hours. Cardiac Enzymes: No results for input(s): CKTOTAL, CKMB, CKMBINDEX, TROPONINI in the last 168 hours. BNP: Invalid input(s): POCBNP CBG: Recent Labs  Lab 07/03/17 2121  GLUCAP 74   D-Dimer No results for input(s):  DDIMER in the last 72 hours. Hgb A1c No results for input(s): HGBA1C in the last 72 hours. Lipid Profile No results for input(s): CHOL, HDL, LDLCALC, TRIG, CHOLHDL, LDLDIRECT in the last 72 hours. Thyroid function studies No results for input(s): TSH, T4TOTAL, T3FREE, THYROIDAB in the last 72 hours.  Invalid input(s): FREET3 Anemia work up No results for input(s): VITAMINB12, FOLATE, FERRITIN, TIBC, IRON, RETICCTPCT in the last 72 hours. Urinalysis No results found for: COLORURINE, APPEARANCEUR, LABSPEC, PHURINE, GLUCOSEU, HGBUR, BILIRUBINUR, KETONESUR, PROTEINUR, UROBILINOGEN, NITRITE, LEUKOCYTESUR Sepsis Labs Invalid input(s): PROCALCITONIN,  WBC,  LACTICIDVEN Microbiology No results found for this or any previous visit (from the past 240 hour(s)).   Time coordinating discharge: Over 30 minutes  SIGNED:   Calvert CantorSaima Coltyn Hanning, MD  [facility] 07/05/2017, 1:37 PM Pager   If 7PM-7AM, please contact night-coverage [email or web address] Password [email or web address]

## 2017-07-02 NOTE — Progress Notes (Signed)
Physical Therapy Treatment Patient Details Name: Tim Berger MRN: [id number] DOB: 05/20/1966 Today's Date: 07/02/2017    History of Present Illness 51 y.o. male with no significant medical history as patient has never seen a doctor who presented from home with Left Sided Weakness and inability to stand. Patient does not speak [place] and only speaks [place] Jar[place] Presented with Left sided weakness, slurred speech and fell. Outside tPA time window.  CT head normal.  CTA head and neck and CT cerebral perfusion negative for large vessel occlusion or perfusion deficit. MRI revealed Acute nonhemorrhagic RIGHT basal ganglia infarct    PT Comments    Pt is making steady progress towards his goals. Pt continues to be limited in his mobility by L sided weakness, R rotator cuff injury and decreased attention to movement of his L side. Pt is currently modA for bed mobility, and modAx2 for transfers. D/c plan remains appropriate at this time. PT will continue to follow acutely.    Follow Up Recommendations  CIR     Equipment Recommendations  Other (comment)(to be determined at next venue)    Recommendations for Other Services Rehab consult     Precautions / Restrictions Precautions Precautions: Fall Restrictions Weight Bearing Restrictions: No    Mobility  Bed Mobility Overal bed mobility: Needs Assistance Bed Mobility: Sidelying to Sit;Rolling Rolling: Min assist Sidelying to sit: Mod assist       General bed mobility comments: modAx1 for coming to upright, intially required maxAx1 for seated balance progressed to min guard  Transfers Overall transfer level: Needs assistance Equipment used: 2 person hand held assist(arms over therapist shoulders) Transfers: Stand Pivot Transfers   Stand pivot transfers: Max assist;+2 physical assistance       General transfer comment: modAx2 for stand pivot transfers to manage L knee buckling and inability to advance L LE in daisy chain from bed  to [facility] to chair to recliner, pt able to stand for 90 sec for pericare after using BSC  Ambulation/Gait             General Gait Details: unable      Modified Rankin (Stroke Patients Only) Modified Rankin (Stroke Patients Only) Pre-Morbid Rankin Score: No symptoms Modified Rankin: Severe disability     Balance Overall balance assessment: Needs assistance Sitting-balance support: Feet supported;Single extremity supported Sitting balance-Leahy Scale: Poor Sitting balance - Comments: initially requires assist against L lateral lean, with vc and use of R UE pt able to maintain seated balance with min guard  Postural control: Left lateral lean   Standing balance-Leahy Scale: Zero                              Cognition Arousal/Alertness: Awake/alert Behavior During Therapy: Restless Overall Cognitive Status: Difficult to assess                                 General Comments: impulsive with movements of R-side, decreased attention to training to PROM.              Pertinent Vitals/Pain Pain Assessment: Faces Faces Pain Scale: Hurts a little bit Pain Location: R shoulder Pain Descriptors / Indicators: Grimacing Pain Intervention(s): Monitored during session;Limited activity within patient's tolerance;Repositioned           PT Goals (current goals can now be found in the care plan section) Acute Rehab PT Goals Patient  Stated Goal: none stated PT Goal Formulation: Patient unable to participate in goal setting Time For Goal Achievement: 07/12/17 Potential to Achieve Goals: Fair Progress towards PT goals: Progressing toward goals    Frequency    Min 4X/week      PT Plan Current plan remains appropriate    Co-evaluation PT/OT/SLP Co-Evaluation/Treatment: Yes Reason for Co-Treatment: For patient/therapist safety;To address functional/ADL transfers PT goals addressed during session: Mobility/safety with mobility        AM-PAC  PT "6 Clicks" Daily Activity  Outcome Measure  Difficulty turning over in bed (including adjusting bedclothes, sheets and blankets)?: Unable Difficulty moving from lying on back to sitting on the side of the bed? : Unable Difficulty sitting down on and standing up from a chair with arms (e.g., wheelchair, bedside commode, etc,.)?: Unable Help needed moving to and from a bed to chair (including a wheelchair)?: Total Help needed walking in hospital room?: Total Help needed climbing 3-5 steps with a railing? : Total 6 Click Score: 6    End of Session Equipment Utilized During Treatment: Gait belt Activity Tolerance: Patient tolerated treatment well Patient left: in chair;with call bell/phone within reach;with chair alarm set;with family/visitor present Nurse Communication: Mobility status;Other (comment)(need for Stedy to return to bed) PT Visit Diagnosis: Unsteadiness on feet (R26.81);Other abnormalities of gait and mobility (R26.89);Muscle weakness (generalized) (M62.81);History of falling (Z91.81);Difficulty in walking, not elsewhere classified (R26.2);Other symptoms and signs involving the nervous system (R29.898);Hemiplegia and hemiparesis Hemiplegia - Right/Left: Left Hemiplegia - dominant/non-dominant: Non-dominant Hemiplegia - caused by: Cerebral infarction     Time: 0946-1009 PT Time Calculation (min) (ACUTE ONLY): 23 min  Charges:  $Therapeutic Activity: 8-22 mins                    G Codes:  Functional Assessment Tool Used: AM-PAC 6 Clicks Basic Mobility Functional Limitation: Mobility: Walking and moving around Mobility: Walking and Moving Around Current Status (E4540(G8978): 100 percent impaired, limited or restricted Mobility: Walking and Moving Around Goal Status (J8119(G8979): At least 60 percent but less than 80 percent impaired, limited or restricted    Tim ManisElizabeth B. Beverely RisenVan Berger PT, DPT Acute Rehabilitation  [phone] Pager [phone]     Elon Alaslizabeth B Tim  Berger 07/02/2017, 10:48 AM

## 2017-07-02 NOTE — Progress Notes (Signed)
I met with pt, tow nieces and niece's husband with interpreter at bedside twice this morning to discuss rehab needs, caregiver support and options. Niece's husband and niece fell they can not care for patient after a 2 to 3 week rehab stay. They have 3 children with the youngest 45 months old. They are aware he will need 24/7 physical care due to his deficits and are unable to provide that level of care. Patient just moved in with them 2 months ago. They are in agreement to SNF rehab until caregiver support long term can be found in the community. I contacted Dr. Wynelle Cleveland and she is aware of plan. I will discuss with RN CM and SW. We will sign off at this time. [phone]

## 2017-07-02 NOTE — Progress Notes (Signed)
Occupational Therapy Treatment Patient Details Name: Tim Berger Defina MRN: [id number] DOB: 03/11/1966 Today's Date: 07/02/2017    History of present illness 51 y.o. male with no significant medical history as patient has never seen a doctor who presented from home with Left Sided Weakness and inability to stand. Patient does not speak [place] and only speaks [place] Jar[place] Presented with Left sided weakness, slurred speech and fell. Outside tPA time window.  CT head normal.  CTA head and neck and CT cerebral perfusion negative for large vessel occlusion or perfusion deficit. MRI revealed Acute nonhemorrhagic RIGHT basal ganglia infarct   OT comments  Pt progressing towards established OT goals. Pt performing stand pivot transfer with Max A +2 and present with L knee buckling. Pt requiring Max A +3 for toileting with +2 for maintaining standing balance and third person performing toilet hygiene. Pt requiring Max cues to attend to LUE and move with RUE. Pt continues to present with decreased functional use of LUE and poor balance. Will continue to follow acutely to facilitate safe dc and continue to recommend dc to CIR.    Follow Up Recommendations  CIR;Supervision/Assistance - 24 hour    Equipment Recommendations  Tub/shower bench;3 in 1 bedside commode    Recommendations for Other Services Rehab consult    Precautions / Restrictions Precautions Precautions: Fall Restrictions Weight Bearing Restrictions: No       Mobility Bed Mobility Overal bed mobility: Needs Assistance Bed Mobility: Sidelying to Sit;Rolling Rolling: Min assist Sidelying to sit: Mod assist       General bed mobility comments: modAx1 for coming to upright, intially required maxAx1 for seated balance progressed to min guard  Transfers Overall transfer level: Needs assistance Equipment used: 2 person hand held assist(arms over therapist shoulders) Transfers: Stand Pivot Transfers Sit to Stand: +2 physical  assistance;Mod assist Stand pivot transfers: Max assist;+2 physical assistance       General transfer comment: modAx2 for stand pivot transfers to manage L knee buckling and inability to advance L LE in daisy chain from bed to [facility] to chair to recliner, pt able to stand for 90 sec for pericare after using BSC    Balance Overall balance assessment: Needs assistance Sitting-balance support: Feet supported;Single extremity supported Sitting balance-Leahy Scale: Poor Sitting balance - Comments: initially requires assist against L lateral lean, with vc and use of R UE pt able to maintain seated balance with min guard  Postural control: Left lateral lean   Standing balance-Leahy Scale: Zero                             ADL either performed or assessed with clinical judgement   ADL Overall ADL's : Needs assistance/impaired     Grooming: Moderate assistance;Wash/dry hands;Sitting Grooming Details (indicate cue type and reason): Mod A to incorporate LUE into bilateral task of hand hygiene.                  Toilet Transfer: +2 for physical assistance;Maximal assistance;BSC;Stand-pivot Toilet Transfer Details (indicate cue type and reason): Pt requiring Max A +2 for stand pivot to [facility] with bilateral knees blocked. Pt with significant L knee buckling during first transfer.  Toileting- Clothing Manipulation and Hygiene: Maximal assistance;+2 for physical assistance;Sit to/from stand(+3) Toileting - Clothing Manipulation Details (indicate cue type and reason): Pt requiring Max A +2 for standing whille third person performed toilet hygiene after BM     Functional mobility during ADLs: Maximal assistance;+2  for physical assistance(stand pivot only) General ADL Comments: Pt requiring Max A +2 for toileting. Continues to demonstrate [employer] stage 1 for LUE with no active movement.      Vision   Vision Assessment?: Vision impaired- to be further tested in functional context    Perception     Praxis      Cognition Arousal/Alertness: Awake/alert Behavior During Therapy: Restless Overall Cognitive Status: Difficult to assess                                 General Comments: impulsive with movements of R-side, decreased attention to training to PROM.         Exercises Exercises: General Upper Extremity General Exercises - Upper Extremity Shoulder Flexion: Left;10 reps;Seated;PROM Elbow Flexion: Left;10 reps;Seated;PROM Elbow Extension: Left;10 reps;Seated;PROM Wrist Flexion: PROM;Left;10 reps;Seated Wrist Extension: PROM;Left;10 reps;Seated Digit Composite Flexion: PROM;Left;10 reps;Seated   Shoulder Instructions       General Comments In person interpreter use throughout session.    Pertinent Vitals/ Pain       Pain Assessment: Faces Faces Pain Scale: Hurts a little bit Pain Location: R shoulder Pain Descriptors / Indicators: Grimacing Pain Intervention(s): Monitored during session;Repositioned  Home Living                                          Prior Functioning/Environment              Frequency  Min 3X/week        Progress Toward Goals  OT Goals(current goals can now be found in the care plan section)  Progress towards OT goals: Progressing toward goals  Acute Rehab OT Goals Patient Stated Goal: none stated OT Goal Formulation: With patient/family Time For Goal Achievement: 07/12/17 Potential to Achieve Goals: Good ADL Goals Pt Will Perform Eating: with modified independence Pt Will Perform Grooming: with set-up;sitting Pt Will Perform Upper Body Bathing: with set-up;with supervision;sitting Pt Will Perform Lower Body Bathing: with min assist;sitting/lateral leans Pt Will Transfer to Toilet: with min assist;bedside commode;squat pivot transfer  Plan Discharge plan remains appropriate    Co-evaluation    PT/OT/SLP Co-Evaluation/Treatment: Yes Reason for Co-Treatment: For  patient/therapist safety PT goals addressed during session: Mobility/safety with mobility OT goals addressed during session: ADL's and self-care      AM-PAC PT "6 Clicks" Daily Activity     Outcome Measure   Help from another person eating meals?: A Little Help from another person taking care of personal grooming?: A Lot Help from another person toileting, which includes using toliet, bedpan, or urinal?: A Lot Help from another person bathing (including washing, rinsing, drying)?: A Lot Help from another person to put on and taking off regular upper body clothing?: A Lot Help from another person to put on and taking off regular lower body clothing?: A Lot 6 Click Score: 13    End of Session Equipment Utilized During Treatment: Gait belt  OT Visit Diagnosis: Other abnormalities of gait and mobility (R26.89);Muscle weakness (generalized) (M62.81);Other symptoms and signs involving cognitive function;Hemiplegia and hemiparesis Hemiplegia - Right/Left: Left Hemiplegia - dominant/non-dominant: Non-Dominant Hemiplegia - caused by: Cerebral infarction   Activity Tolerance Patient tolerated treatment well   Patient Left in chair;with call bell/phone within reach;with chair alarm set;with family/visitor present   Nurse Communication Mobility status  Time: [id number] OT Time Calculation (min): 23 min  Charges: OT General Charges $OT Visit: 1 Visit OT Treatments $Self Care/Home Management : 8-22 mins  Artemisia Auvil MSOT, OTR/L Acute Rehab Pager: [phone] Office: [phone]   Theodoro GristCharis M Marilu Rylander 07/02/2017, 12:00 PM

## 2017-07-03 LAB — GLUCOSE, CAPILLARY: Glucose-Capillary: 74 mg/dL (ref 65–99)

## 2017-07-03 NOTE — Progress Notes (Signed)
PROGRESS NOTE    Tim Berger   ZOX:[id number]  DOB: 11/06/1965  DOA: 06/27/2017 PCP: Patient, No Pcp Per   Brief Narrative:  Tim CurtKut Corbridge is a 51 y.o.[place] patient has never seen a doctor who presented from home with Left Sided Weakness and inability to stand. He is found to have an acute CVA   Subjective: No complaints.   .   Assessment & Plan:   Active Problems: Cerebrovascular accident (CVA) - left arm strength 0/5. Left leg 4/5 -CT head unremarkable -CTA head and neck is negative for marked large vessel occlusion. -MRI of brain showed acute nonhemorrhagic right basal ganglia infarct. Mild chronic small vessel ischemic disease. -MRA head showed focal high-grade flow limiting stenosis proximal right M2 segment. No emergent large vessel occlusion. -Echocardiogram shows an EF of 65-70%, grade 1 diastolic dysfunction -Hemoglobin A1c 6, LDL 182 -Plan:  statin, Plavix, aspirin -PT/OT recommended CIR- pending insurance approval -  Dr. Roda ShuttersXu recommended 30 day event monitor- cardiology made aware    Accelerated hypertension - start Losartan  Right shoulder pain MRI: Complete fatty replacement of all the rotator cuff musculature without tendon tear. Edema in the imaged trapezius is consistent with early denervation atrophy change. Moderately severe atrophy of the deltoid is also present. Cause for these findings is not Identified. - PT to manage    Hyponatremia   Hypokalemia - improved with replacement  Glucose intolerance - A1c is 6    Hyperlipidemia - statin started   DVT prophylaxis: Lovenox Code Status: Full code Family Communication:  Disposition Plan: awaiting SNF bed Consultants:   neuro Procedures:   2 D ECHO Antimicrobials:  Anti-infectives (From admission, onward)   None       Objective: Vitals:   07/03/17 0111 07/03/17 0546 07/03/17 1014 07/03/17 1341  BP: 132/88 (!) 131/91 123/89 (!) 131/92    Pulse: 86 93 86 90  Resp: 18  19 20   Temp: 97.8 F (36.6 C) 97.8 F (36.6 C) 97.9 F (36.6 C) 98.8 F (37.1 C)  TempSrc: Oral Oral Oral Oral  SpO2: 98% 100% 98% 95%   No intake or output data in the 24 hours ending 07/03/17 1536 There were no vitals filed for this visit.  Examination: General exam: Appears comfortable  HEENT: PERRLA, oral mucosa moist, no sclera icterus or thrush Respiratory system: Clear to auscultation. Respiratory effort normal. Cardiovascular system: S1 & S2 heard, RRR.  No murmurs  Gastrointestinal system: Abdomen soft, non-tender, nondistended. Normal bowel sound. No organomegaly Central nervous system: Alert and oriented. Left arm 0/5, left leg 4/5 strength Extremities: No cyanosis, clubbing or edema Skin: No rashes or ulcers Psychiatry:  Mood & affect appropriate.     Data Reviewed: I have personally reviewed following labs and imaging studies  CBC: Recent Labs  Lab 06/27/17 1124 06/27/17 1129 06/28/17 0502  WBC 8.8  --  8.0  NEUTROABS 5.2  --  4.9  HGB 13.3 15.0 13.7  HCT 41.1 44.0 42.1  MCV 67.0*  --  67.5*  PLT 197  --  214   Basic Metabolic Panel: Recent Labs  Lab 06/27/17 1124 06/27/17 1129 06/28/17 0502 06/29/17 0411 06/30/17 0348  NA 131* 138 137 136 138  K 3.4* 3.7 3.2* 3.3* 3.6  CL 99* 100* 101 102 103  CO2 22  --  27 25 26   GLUCOSE 143* 141* 97 103* 102*  BUN 12 15 16 16 17   CREATININE 0.89 0.80 0.89 0.85 1.00  CALCIUM 8.9  --  8.9 9.2 9.4  MG  --   --  2.2  --   --   PHOS  --   --  4.3  --   --    GFR: CrCl cannot be calculated (Unknown ideal weight.). Liver Function Tests: Recent Labs  Lab 06/27/17 1124 06/28/17 0502  AST 36 32  ALT 15* 11*  ALKPHOS 110 115  BILITOT 0.6 0.8  PROT 7.6 7.8  ALBUMIN 4.1 4.2   No results for input(s): LIPASE, AMYLASE in the last 168 hours. No results for input(s): AMMONIA in the last 168 hours. Coagulation Profile: Recent Labs  Lab 06/27/17 1124  INR 1.01   Cardiac  Enzymes: No results for input(s): CKTOTAL, CKMB, CKMBINDEX, TROPONINI in the last 168 hours. BNP (last 3 results) No results for input(s): PROBNP in the last 8760 hours. HbA1C: No results for input(s): HGBA1C in the last 72 hours. CBG: Recent Labs  Lab 06/27/17 1127  GLUCAP 133*   Lipid Profile: No results for input(s): CHOL, HDL, LDLCALC, TRIG, CHOLHDL, LDLDIRECT in the last 72 hours. Thyroid Function Tests: No results for input(s): TSH, T4TOTAL, FREET4, T3FREE, THYROIDAB in the last 72 hours. Anemia Panel: No results for input(s): VITAMINB12, FOLATE, FERRITIN, TIBC, IRON, RETICCTPCT in the last 72 hours. Urine analysis: No results found for: COLORURINE, APPEARANCEUR, LABSPEC, PHURINE, GLUCOSEU, HGBUR, BILIRUBINUR, KETONESUR, PROTEINUR, UROBILINOGEN, NITRITE, LEUKOCYTESUR Sepsis Labs: @LABRCNTIP (procalcitonin:4,lacticidven:4) )No results found for this or any previous visit (from the past 240 hour(s)).       Radiology Studies: No results found.    Scheduled Meds: . aspirin  300 mg Rectal Once  . aspirin  300 mg Rectal Daily   Or  . aspirin  325 mg Oral Daily  . atorvastatin  80 mg Oral q1800  . clopidogrel  75 mg Oral Daily  . enoxaparin (LOVENOX) injection  40 mg Subcutaneous Q24H  . losartan  50 mg Oral Daily   Continuous Infusions:   LOS: 5 days    Time spent in minutes: 35    Calvert CantorSaima Kaien Pezzullo, MD [facility] Pager: [email or web address] Password [email or web address] 07/03/2017, 3:36 PM

## 2017-07-04 LAB — CREATININE, SERUM
Creatinine, Ser: 0.96 mg/dL (ref 0.61–1.24)
GFR calc Af Amer: >60 mL/min (ref >60)

## 2017-07-04 NOTE — Social Work (Signed)
CSW called SNF-[facility] and unable to reach to confirm if Insurance Auth received yet.  CSW will continue to follow-up.  Keene BreathPatricia Unique Sillas, LCSW Clinical Social Worker [phone]

## 2017-07-04 NOTE — Progress Notes (Signed)
  Speech Language Pathology Treatment: Dysphagia  Patient Details Name: Rudean CurtKut Bergsma MRN: [id number] DOB: 01/31/1966 Today's Date: 07/04/2017 Time: [id number] SLP Time Calculation (min) (ACUTE ONLY): 22 min  Assessment / Plan / Recommendation Clinical Impression  Skilled observation/differential diagnosis during consumption of various consistencies ranging from thin via straw to Dysphagia 2/3 requiring mod verbal/visual cues for consumption/left lingual sweep d/t left inattention and decreased sensory awareness; no overt s/s of aspiration noted with any consistency; Dysphagia 2/thin liquids recommended d/t decreased attention/awareness; continue to monitor for diet tolerance and progression of diet during acute stay; nursing (fluent in pt's first language) interpreted swallowing precautions/strategies during session with pt in agreement for strategies, but needs mod verbal/visual cues to follow through with these precautions.  HPI HPI: 51 y.o. male with no significant medical history as patient has never seen a doctor who presented from home with Left Sided Weakness and inability to stand. Patient does not speak [place] and only speaks [name] so history was obtained from patient's friend who was present at bedside and translated. Patient was doing well until 3:00 pm yesterday when he had some Left sided weakness and fell. He was on the floor when friends got him up to the bed and massaged him to try to make him feel better. This AM he was still unable to stand with assistance so because of concern friends brought him to the hospital. Patient states that he thought he had some slurring of words but does not recall. Denied any N/V/D and denied any CP or SOB. [facility] was called to admit for Stroke Workup and Neurology was consulted for further evaluation      SLP Plan  Continue with current plan of care       Recommendations  Diet recommendations: Dysphagia 2 (fine chop);Thin liquid Liquids  provided via: Cup;Straw Medication Administration: Whole meds with puree Supervision: Staff to assist with self feeding;Full supervision/cueing for compensatory strategies Compensations: Minimize environmental distractions;Slow rate;Small sips/bites;Lingual sweep for clearance of pocketing Postural Changes and/or Swallow Maneuvers: Seated upright 90 degrees                Oral Care Recommendations: Oral care BID SLP Visit Diagnosis: Dysphagia, oral phase (R13.11) Plan: Continue with current plan of care                       Tressie StalkerPat Domingue Coltrain, M.S., CCC-SLP 07/04/2017, 4:10 PM

## 2017-07-04 NOTE — Social Work (Addendum)
CSW reviewed chart. Pt will need insurance auth for SNF placement. Pt has 2 bed offers. CSW will f/u with family and patient to select SNF so that SNF can initiate insurance auth.  CSW will f/u as patient will not be able to dc without Insurance Auth.  10:45am: CSW called niece and discussed SNF bed offers. She indicated she will discuss with patient and family and call CSW back. CSW indicated that SNF would need to initiate insurance auth immediately for placement. CSW indicated that doctor is discharging patient. Family will call CSW back to confirm.  11:10am: CSW received call back from family confirming they will accept SNF bed at [facility].  [facility] and [facility] indicated that she will begin the initiate Insurance Auth.  Keene BreathPatricia Deshonda Cryderman, LCSW Clinical Social Worker [phone]

## 2017-07-04 NOTE — Progress Notes (Addendum)
Physical Therapy Treatment Patient Details Name: Tim Berger MRN: [id number] DOB: 08/20/1965 Today's Date: 07/04/2017    History of Present Illness 51 y.o. male with no significant medical history as patient has never seen a doctor who presented from home with Left Sided Weakness and inability to stand. Patient does not speak [place] and only speaks [place]agn[place] Jarai Presented with Left sided weakness, slurred speech and fell. Outside tPA time window.  CT head normal.  CTA head and neck and CT cerebral perfusion negative for large vessel occlusion or perfusion deficit. MRI revealed Acute nonhemorrhagic RIGHT basal ganglia infarct    PT Comments    Patient progressing well towards PT goals. Tolerated dynamic standing tasks at sink but requires Mod A for balance due to heavy left lean and left knee instability. Pt impulsive at times but this may be due to difficulty communicating due to language barrier. Family not present during session to interpret. Tolerated SPT to chair with Max A of 2 to assist with weight shifting and sequencing steps. Discharge recommendation updated to SNF as family denies CIR. Will continue to follow.   Follow Up Recommendations  SNF     Equipment Recommendations  Other (comment)(defer to next venue)    Recommendations for Other Services       Precautions / Restrictions Precautions Precautions: Fall Restrictions Weight Bearing Restrictions: No    Mobility  Bed Mobility Overal bed mobility: Needs Assistance Bed Mobility: Rolling;Sidelying to Sit Rolling: Min guard Sidelying to sit: Mod assist;HOB elevated       General bed mobility comments: Able to roll towards left side and bring LEs to EOB but needs assist to elevate trunk.   Transfers Overall transfer level: Needs assistance Equipment used: (back of chair) Transfers: Sit to/from Stand Sit to Stand: Min assist;+2 physical assistance Stand pivot transfers: Max assist;+2 physical assistance        General transfer comment: Assist of 2 to come to standing using back of chair for support; left knee instability noted. Cues for upright posture. Stood from [employer] Morgan EnergyEOB x2, from chair x1. Able to take a few steps to get to chair with assist of 2 for weight shifting with left knee buckling requiring support.   Ambulation/Gait             General Gait Details: unable   Stairs            Wheelchair Mobility    Modified Rankin (Stroke Patients Only) Modified Rankin (Stroke Patients Only) Pre-Morbid Rankin Score: No symptoms Modified Rankin: Moderately severe disability     Balance Overall balance assessment: Needs assistance Sitting-balance support: Feet supported;No upper extremity supported Sitting balance-Leahy Scale: Poor Sitting balance - Comments: Initially immediately falls to left side but with feet on floor and verbal cues for upright, able to maintain static sitting balance without support.  Postural control: Left lateral lean Standing balance support: During functional activity;Bilateral upper extremity supported Standing balance-Leahy Scale: Poor Standing balance comment: Requires BUE support in standing as well as therapist support esp for left knee. Tolerated weight shifting to right/left with Min A. Brushed teeth at sink with Mod A due to heavy left lean with therapist supporting left knee.                             Cognition Arousal/Alertness: Awake/alert Behavior During Therapy: Flat affect;Impulsive Overall Cognitive Status: Difficult to assess  General Comments: Decreased attention to right side during functional tasks. Difficult to assess cognition due to non english speaking. Able to follow simple gestural commands.       Exercises      General Comments General comments (skin integrity, edema, etc.): Family not present to interpret this session.      Pertinent Vitals/Pain Pain Assessment:  Faces Faces Pain Scale: No hurt    Home Living                      Prior Function            PT Goals (current goals can now be found in the care plan section) Progress towards PT goals: Progressing toward goals    Frequency    Min 3X/week      PT Plan Discharge plan needs to be updated;Frequency needs to be updated    Co-evaluation PT/OT/SLP Co-Evaluation/Treatment: Yes Reason for Co-Treatment: For patient/therapist safety;To address functional/ADL transfers PT goals addressed during session: Mobility/safety with mobility;Balance;Strengthening/ROM        AM-PAC PT "6 Clicks" Daily Activity  Outcome Measure  Difficulty turning over in bed (including adjusting bedclothes, sheets and blankets)?: None Difficulty moving from lying on back to sitting on the side of the bed? : Unable Difficulty sitting down on and standing up from a chair with arms (e.g., wheelchair, bedside commode, etc,.)?: Unable Help needed moving to and from a bed to chair (including a wheelchair)?: Total Help needed walking in hospital room?: Total Help needed climbing 3-5 steps with a railing? : Total 6 Click Score: 9    End of Session Equipment Utilized During Treatment: Gait belt Activity Tolerance: Patient tolerated treatment well Patient left: in chair;with call bell/phone within reach;with chair alarm set Nurse Communication: Mobility status PT Visit Diagnosis: Unsteadiness on feet (R26.81);Other abnormalities of gait and mobility (R26.89);Muscle weakness (generalized) (M62.81);History of falling (Z91.81);Difficulty in walking, not elsewhere classified (R26.2);Other symptoms and signs involving the nervous system (R29.898);Hemiplegia and hemiparesis Hemiplegia - Right/Left: Left Hemiplegia - dominant/non-dominant: Non-dominant Hemiplegia - caused by: Cerebral infarction     Time: [id number] PT Time Calculation (min) (ACUTE ONLY): 26 min  Charges:  $Therapeutic Activity: 8-22  mins                    G Codes:       Tim Berger, PT, DPT [phone]     Tim Berger 07/04/2017, 11:32 AM

## 2017-07-04 NOTE — Evaluation (Signed)
Occupational Therapy Evaluation Patient Details Name: Tim Berger MRN: [id number] DOB: 06/05/1966 Today's Date: 07/04/2017    History of Present Illness 51 y.o. male with no significant medical history as patient has never seen a doctor who presented from home with Left Sided Weakness and inability to stand. Patient does not speak [place] and only speaks [place]agnard Jarai Presented with Left sided weakness, slurred speech and fell. Outside tPA time window.  CT head normal.  CTA head and neck and CT cerebral perfusion negative for large vessel occlusion or perfusion deficit. MRI revealed Acute nonhemorrhagic RIGHT basal ganglia infarct   Clinical Impression   Pt required max assist +2 for stand pivot transfer this session. Able to perform grooming task x2 standing at the sink with mod assist for tasks and +2 assist for standing balance. D/c plan updated to SNF for follow up. Will continue to follow acutely.     Follow Up Recommendations  SNF;Supervision/Assistance - 24 hour    Equipment Recommendations  Tub/shower bench;3 in 1 bedside commode    Recommendations for Other Services Rehab consult     Precautions / Restrictions Precautions Precautions: Fall Restrictions Weight Bearing Restrictions: No      Mobility Bed Mobility Overal bed mobility: Needs Assistance Bed Mobility: Rolling;Sidelying to Sit Rolling: Min guard Sidelying to sit: Mod assist;HOB elevated       General bed mobility comments: Able to roll towards left side and bring LEs to EOB but needs assist to elevate trunk.   Transfers Overall transfer level: Needs assistance Equipment used: (back of chair) Transfers: Sit to/from [employer] CorporationStand;Stand Pivot Transfers Sit to Stand: Min assist;+2 physical assistance Stand pivot transfers: Max assist;+2 physical assistance       General transfer comment: Assist of 2 to come to standing using back of chair for support; left knee instability noted. Cues for upright posture. Stood  from [employer] EnergyEOB x2, from chair x1. Able to take a few steps to get to chair with assist of 2 for weight shifting with left knee buckling requiring support.     Balance Overall balance assessment: Needs assistance Sitting-balance support: Feet supported;No upper extremity supported Sitting balance-Leahy Scale: Poor Sitting balance - Comments: Initially immediately falls to left side but with feet on floor and verbal cues for upright, able to maintain static sitting balance without support.  Postural control: Left lateral lean Standing balance support: During functional activity;Bilateral upper extremity supported Standing balance-Leahy Scale: Poor Standing balance comment: Requires BUE support in standing as well as therapist support esp for left knee. Tolerated weight shifting to right/left with Min A. Brushed teeth at sink with Mod A due to heavy left lean with therapist supporting left knee.                            ADL either performed or assessed with clinical judgement   ADL Overall ADL's : Needs assistance/impaired     Grooming: Moderate assistance;Standing;Oral care;Wash/dry face(+2 assist for standing balance)                   Toilet Transfer: Maximal assistance;+2 for physical assistance;Stand-pivot Toilet Transfer Details (indicate cue type and reason): Simulated by stand pivot EOB to chair         Functional mobility during ADLs: Maximal assistance;+2 for physical assistance(for stand pivot only)       Vision         Perception     Praxis  Pertinent Vitals/Pain Pain Assessment: Faces Faces Pain Scale: No hurt     Hand Dominance     Extremity/Trunk Assessment             Communication     Cognition Arousal/Alertness: Awake/alert Behavior During Therapy: Flat affect;Impulsive Overall Cognitive Status: Difficult to assess                                 General Comments: Decreased attention to right side during  functional tasks. Difficult to assess cognition due to non english speaking. Able to follow simple gestural commands.    General Comments  Family not present to interpret this session.    Exercises     Shoulder Instructions      Home Living                                          Prior Functioning/Environment                   OT Problem List:        OT Treatment/Interventions:      OT Goals(Current goals can be found in the care plan section) Acute Rehab OT Goals Patient Stated Goal: none stated  OT Frequency: Min 3X/week   Barriers to D/C:            Co-evaluation PT/OT/SLP Co-Evaluation/Treatment: Yes Reason for Co-Treatment: For patient/therapist safety;To address functional/ADL transfers PT goals addressed during session: Mobility/safety with mobility;Balance;Strengthening/ROM OT goals addressed during session: ADL's and self-care      AM-PAC PT "6 Clicks" Daily Activity     Outcome Measure Help from another person eating meals?: A Little Help from another person taking care of personal grooming?: A Lot Help from another person toileting, which includes using toliet, bedpan, or urinal?: A Lot Help from another person bathing (including washing, rinsing, drying)?: A Lot Help from another person to put on and taking off regular upper body clothing?: A Lot Help from another person to put on and taking off regular lower body clothing?: A Lot 6 Click Score: 13   End of Session Equipment Utilized During Treatment: Gait belt  Activity Tolerance: Patient tolerated treatment well Patient left: in chair;with call bell/phone within reach;with chair alarm set  OT Visit Diagnosis: Other abnormalities of gait and mobility (R26.89);Muscle weakness (generalized) (M62.81);Other symptoms and signs involving cognitive function;Hemiplegia and hemiparesis Hemiplegia - Right/Left: Left Hemiplegia - dominant/non-dominant: Non-Dominant Hemiplegia - caused  by: Cerebral infarction                Time: 1610-9604: 0956-1022 OT Time Calculation (min): 26 min Charges:  OT General Charges $OT Visit: 1 Visit OT Treatments $Self Care/Home Management : 8-22 mins G-Codes:     Tim Berger, M.S., Tim Berger  Tim Berger 07/04/2017, 1:16 PM

## 2017-07-04 NOTE — Progress Notes (Signed)
PROGRESS NOTE    Tim Berger   ZOX:[id number]  DOB: 05/28/1966  DOA: 06/27/2017 PCP: Patient, No Pcp Per   Brief Narrative:  Tim Berger is a 51 y.o.[place] patient has [place] seen a doctor who presented from home with Left Sided Weakness and inability to stand. He is found to have an acute CVA   Subjective: No complaints.    Assessment & Plan:   Active Problems: Cerebrovascular accident (CVA) - left arm strength 0/5. Left leg 4/5 -CT head unremarkable -CTA head and neck is negative for marked large vessel occlusion. -MRI of brain showed acute nonhemorrhagic right basal ganglia infarct. Mild chronic small vessel ischemic disease. -MRA head showed focal high-grade flow limiting stenosis proximal right M2 segment. No emergent large vessel occlusion. -Echocardiogram shows an EF of 65-70%, grade 1 diastolic dysfunction -Hemoglobin A1c 6, LDL 182 -Plan:  statin, Plavix, aspirin -PT/OT recommended CIR- pending insurance approval -  Dr. Roda ShuttersXu recommended 30 day event monitor- cardiology made aware    Accelerated hypertension - start Losartan  Right shoulder pain MRI: Complete fatty replacement of all the rotator cuff musculature without tendon tear. Edema in the imaged trapezius is consistent with early denervation atrophy change. Moderately severe atrophy of the deltoid is also present. Cause for these findings is not Identified. - PT to manage    Hyponatremia   Hypokalemia - improved with replacement  Glucose intolerance - A1c is 6    Hyperlipidemia - statin started   DVT prophylaxis: Lovenox Code Status: Full code Family Communication:  Disposition Plan: still awaiting SNF bed Consultants:   neuro Procedures:   2 D ECHO Antimicrobials:  Anti-infectives (From admission, onward)   None       Objective: Vitals:   07/04/17 0100 07/04/17 0500 07/04/17 1016 07/04/17 1326  BP: (!) 146/93 (!) 145/90 (!) 136/105 (!)  132/94  Pulse: 75 65 88 77  Resp: 18 18 19 19   Temp: 98.1 F (36.7 C) 98.6 F (37 C) (!) 97.4 F (36.3 C) 98.2 F (36.8 C)  TempSrc: Oral Oral Oral Oral  SpO2: 94% 97% 96% 99%   No intake or output data in the 24 hours ending 07/04/17 1419 There were no vitals filed for this visit.  Examination: exam unchanged from yesterday General exam: Appears comfortable  HEENT: PERRLA, oral mucosa moist, no sclera icterus or thrush Respiratory system: Clear to auscultation. Respiratory effort normal. Cardiovascular system: S1 & S2 heard, RRR.  No murmurs  Gastrointestinal system: Abdomen soft, non-tender, nondistended. Normal bowel sound. No organomegaly Central nervous system: Alert and oriented. Left arm 0/5, left leg 4/5 strength Extremities: No cyanosis, clubbing or edema Skin: No rashes or ulcers Psychiatry:  Mood & affect appropriate.     Data Reviewed: I have personally reviewed following labs and imaging studies  CBC: Recent Labs  Lab 06/28/17 0502  WBC 8.0  NEUTROABS 4.9  HGB 13.7  HCT 42.1  MCV 67.5*  PLT 214   Basic Metabolic Panel: Recent Labs  Lab 06/28/17 0502 06/29/17 0411 06/30/17 0348 07/04/17 0513  NA 137 136 138  --   K 3.2* 3.3* 3.6  --   CL 101 102 103  --   CO2 27 25 26   --   GLUCOSE 97 103* 102*  --   BUN 16 16 17   --   CREATININE 0.89 0.85 1.00 0.96  CALCIUM 8.9 9.2 9.4  --   MG 2.2  --   --   --  PHOS 4.3  --   --   --    GFR: CrCl cannot be calculated (Unknown ideal weight.). Liver Function Tests: Recent Labs  Lab 06/28/17 0502  AST 32  ALT 11*  ALKPHOS 115  BILITOT 0.8  PROT 7.8  ALBUMIN 4.2   No results for input(s): LIPASE, AMYLASE in the last 168 hours. No results for input(s): AMMONIA in the last 168 hours. Coagulation Profile: No results for input(s): INR, PROTIME in the last 168 hours. Cardiac Enzymes: No results for input(s): CKTOTAL, CKMB, CKMBINDEX, TROPONINI in the last 168 hours. BNP (last 3 results) No results  for input(s): PROBNP in the last 8760 hours. HbA1C: No results for input(s): HGBA1C in the last 72 hours. CBG: Recent Labs  Lab 07/03/17 2121  GLUCAP 74   Lipid Profile: No results for input(s): CHOL, HDL, LDLCALC, TRIG, CHOLHDL, LDLDIRECT in the last 72 hours. Thyroid Function Tests: No results for input(s): TSH, T4TOTAL, FREET4, T3FREE, THYROIDAB in the last 72 hours. Anemia Panel: No results for input(s): VITAMINB12, FOLATE, FERRITIN, TIBC, IRON, RETICCTPCT in the last 72 hours. Urine analysis: No results found for: COLORURINE, APPEARANCEUR, LABSPEC, PHURINE, GLUCOSEU, HGBUR, BILIRUBINUR, KETONESUR, PROTEINUR, UROBILINOGEN, NITRITE, LEUKOCYTESUR Sepsis Labs: @LABRCNTIP (procalcitonin:4,lacticidven:4) )No results found for this or any previous visit (from the past 240 hour(s)).       Radiology Studies: No results found.    Scheduled Meds: . aspirin  300 mg Rectal Once  . aspirin  300 mg Rectal Daily   Or  . aspirin  325 mg Oral Daily  . atorvastatin  80 mg Oral q1800  . clopidogrel  75 mg Oral Daily  . enoxaparin (LOVENOX) injection  40 mg Subcutaneous Q24H  . losartan  50 mg Oral Daily   Continuous Infusions:   LOS: 6 days    Time spent in minutes: 35    Calvert CantorSaima Yoshiaki Kreuser, MD [facility] Pager: [email or web address] Password [email or web address] 07/04/2017, 2:19 PM

## 2017-07-04 NOTE — Progress Notes (Signed)
Assess patient for placing IV access. Pt currently has no medication infusions ordered at this time. Has had a stroke to the L side with no independent mobility. Spoke with patient's nurse Nehemiah SettleBrooke RN and notified no access recommended at this time d/t limited options, but to notify [facility] at any time for access needs. Brooke [employer]gate[employer]N VU. Tomasita MorrowHeather Taiki Buckwalter, RN [facility]

## 2017-07-05 NOTE — Clinical Social Work Placement (Signed)
   CLINICAL SOCIAL WORK PLACEMENT  NOTE [facility]  Date:  07/05/2017  Patient Details  Name: Tim Berger MRN: [id number] Date of Birth: 04/16/1966  Clinical Social Work is seeking post-discharge placement for this patient at the Skilled  Nursing Facility level of care (*CSW will initial, date and re-position this form in  chart as items are completed):  Yes   Patient/family provided with [place] Clinical Social Work Department's list of facilities offering this level of care within the geographic area requested by the patient (or if unable, by the patient's family).  Yes   Patient/family informed of their freedom to choose among providers that offer the needed level of care, that participate in Medicare, Medicaid or managed care program needed by the patient, have an available bed and are willing to accept the patient.  Yes   Patient/family informed of [facility]'s ownership interest in [facility] and [facility], as well as of the fact that they are under no obligation to receive care at these facilities.  PASRR submitted to EDS on 07/02/17     PASRR number received on 07/02/17     Existing PASRR number confirmed on       FL2 transmitted to all facilities in geographic area requested by pt/family on 07/02/17     FL2 transmitted to all facilities within larger geographic area on       Patient informed that his/her managed care company has contracts with or will negotiate with certain facilities, including the following:        Yes   Patient/family informed of bed offers received.  Patient chooses bed at Other - please specify in the comment section below:([facility])     Physician recommends and patient chooses bed at      Patient to be transferred to Other - please specify in the comment section below:([facility]) on 07/05/17.  Patient to be transferred to facility by [facility]     Patient family notified on   of transfer.  Name of family member  notified:  Milo, neice     PHYSICIAN Please sign FL2     Additional Comment:    _______________________________________________ Doy HutchingIsabel H Markeda Narvaez, LCSWA 07/05/2017, 3:51 PM

## 2017-07-05 NOTE — Social Work (Addendum)
CSW called [facility] again as a follow up to see if insurance authorization had been received. Left message for admissions.  3:35pm- received confirmation of insurance authorization for pt.   3:45pm- CSW contacted pt niece who will call and let pt know of discharge information.   CSW continuing to follow to support discharge to [facility] [place].  Doy HutchingIsabel H Rubin Dais, LCSWA [facility] Clinical Social Work [phone]

## 2017-07-05 NOTE — Progress Notes (Signed)
Attempted to call report to SNF--[facility] at [phone], line busy, attempted several times. Will try again later. Waiting transport pickup.

## 2017-07-05 NOTE — Progress Notes (Signed)
[facility]  The patient has been waiting for a bed for a number or days and today has a bed at SNF. He is stable for discharge. Please see my d/c summary from 07/02/17.  Calvert CantorSaima Lasasha Brophy, MD

## 2017-07-05 NOTE — Progress Notes (Signed)
Pt picked up by [facility] at this time to transport to SNF--[facility]. Belongings in room sent with pt. Pt's niece contacted pt and informed him of discharging to SNF, copy of d/c instructions placed with packet of information for facility taken by transporters.  Instructions in [place] and [place].  Will try to call report again. Only getting a busy signal at number [phone].

## 2017-07-05 NOTE — Social Work (Signed)
Clinical Social Worker facilitated patient discharge including contacting patient family and facility to confirm patient discharge plans.  Clinical information faxed to facility and family agreeable with plan.  CSW arranged ambulance transport via [facility] to [facility] Rm 139 RN to call [phone] with report prior to discharge.  Clinical Social Worker will sign off for now as social work intervention is no longer needed. Please consult us again if new need arises.  Doy HutchingIsabel H Larue Drawdy, LCSWA Clinical Social Worker

## 2017-07-06 LAB — ALPHA GALACTOSIDASE: Alpha galactosidase, serum: 33 nmol/hr/mg prt (ref 28.0–80.0)

## 2017-08-08 ENCOUNTER — Telehealth: Payer: Self-pay | Admitting: Internal Medicine

## 2017-08-08 NOTE — Telephone Encounter (Signed)
New message  FYI Order for monitor cancelled as pt refused.    I don't know anything about this patient. Looks like [place], [place] ordered. I don't even see Dr Johney FrameAllred saw the patient in the hospital. Will need to ask her and see what she says as far as monitor.    Thanks,    Tresa EndoKelly  ----- Message -----  From: Silas SacramentoPrice, Donna M  Sent: 08/07/2017 10:34 AM  To: Deliah BostonKelly F Lanier, RN    FYI  Called to resc monitor. Patient's niece said pt had a stroke and is not in a "facility". She also states that he cannot walk and that she did not think he would be able to come in to get the monitor. Do you want me to cancel the order?   Ellsworth LennoxStrader, Brittany M, PA-C  Silas SacramentoPrice, Donna M  Cc: Deliah BostonLanier, Kelly F, RN        The Neurologist had contacted Trish about the need for a 30-day monitor following his CVA and she just had me enter the order. Cardiology never evaluated the patient in the hospital.   If the patient's family declines, I would just document it in the chart so Neurology is aware.   Thanks,  [place]

## 2017-09-03 ENCOUNTER — Other Ambulatory Visit: Payer: Self-pay | Admitting: Neurology

## 2017-09-03 ENCOUNTER — Encounter: Payer: Self-pay | Admitting: Neurology

## 2017-09-03 ENCOUNTER — Telehealth: Payer: Self-pay | Admitting: Neurology

## 2017-09-03 ENCOUNTER — Ambulatory Visit (INDEPENDENT_AMBULATORY_CARE_PROVIDER_SITE_OTHER): Payer: BLUE CROSS/BLUE SHIELD | Admitting: Neurology

## 2017-09-03 ENCOUNTER — Encounter (INDEPENDENT_AMBULATORY_CARE_PROVIDER_SITE_OTHER): Payer: Self-pay

## 2017-09-03 VITALS — BP 151/100 | HR 72 | Ht 64.0 in

## 2017-09-03 DIAGNOSIS — G8102 Flaccid hemiplegia affecting left dominant side: Secondary | ICD-10-CM

## 2017-09-03 DIAGNOSIS — G8194 Hemiplegia, unspecified affecting left nondominant side: Secondary | ICD-10-CM

## 2017-09-03 MED ORDER — ASPIRIN 325 MG PO TABS: 325.0000 mg | ORAL_TABLET | Freq: Every day | ORAL | 0 refills | Status: DC

## 2017-09-03 MED ORDER — CLOPIDOGREL BISULFATE 75 MG PO TABS: 75.0000 mg | ORAL_TABLET | Freq: Every day | ORAL | 0 refills | Status: DC

## 2017-09-03 MED ORDER — LOSARTAN POTASSIUM 50 MG PO TABS: 50.0000 mg | ORAL_TABLET | Freq: Every day | ORAL | 0 refills | Status: DC

## 2017-09-03 MED ORDER — ATORVASTATIN CALCIUM 80 MG PO TABS: 80.0000 mg | ORAL_TABLET | Freq: Every day | ORAL | 0 refills | Status: DC

## 2017-09-03 NOTE — Progress Notes (Signed)
[facility] [address] [place] [id number] [phone]       OFFICE FOLLOW-UP NOTE  Tim Berger Date of Birth:  04/10/1966 Medical Record Number:  [id number]   HPI: Tim Berger is a 52 year old [place] male who is accompanied today by his friend. Unfortunately a formal [place] interpreter was not available during this visit. History is obtained from review of electronic medical records and the patient's friend translating for him. I have personally reviewed imaging films.Tim Berger is an 52 y.o. male with no significant PMHx [facility] as a code stroke.Patient was last seen normal at 15:00 yesterday when he went to lay down for a nap. He woke upat 1800 yesterdaywith left-sided weakness and slurred speech. Patient continued to have symptoms through the evening and this morning when family called for help.Family present in the ED is not aware of patient's PMHx.LSN: 1500 on 06/26/18.tPA Given: No: Presented outside tPA window. He presented with significant left hemiplegia with arm greater than leg weakness. MRI showed a fairly large right basal ganglia infarct and MRA showed abrupt occlusion of the right M2 segment of the middle cerebral artery. Transthoracic echo showed normal ejection fraction. Cardiac telemetry monitoring did not reveal cardiac arrhytmias. . Patient was started on dual antiplatelet therapy. LDL cholesterol was 182 mg percent for which he was started on Lipitor 80 mg daily. Hemoglobin A1c was 6.0. HIV and ANA were negative. Anticardiolipin antibodies, homocystine, beta 2 glycoprotein and the placenta:, All negative Plan was to do 30 day heart monitor after discharge but this has not happened. Patient was transferred to inpatient rehabilitation and is presently living at home. He still has significant left-sided weakness as well as right shoulder and arm weakness. He barely walks a little but requires 2 person assist.  He has not been getting any home physical occupational therapy and has not been referred to outpatient therapy. He in fact has ran out of his medications which have not been refilled. He does not have a primary care physician. Patient had MRI scan of his right shoulder done on 06/29/17 which showed complete fatty replacement of the rotator cuff muscles without any tendon tear with edema of the trapezius muscle. An exact cause of this finding was not identified.   ROS:   14 system review of systems is positive for shoulder pain, arm weakness, difficulty walking, all other systems negative  PMH:  Past Medical History:  Diagnosis Date  . Stroke [facility](HCC)     Social History:  Social History   Socioeconomic History  . Marital status: Married    Spouse name: Not on file  . Number of children: Not on file  . Years of education: Not on file  . Highest education level: Not on file  Social Needs  . Financial resource strain: Not on file  . Food insecurity - worry: Not on file  . Food insecurity - inability: Not on file  . Transportation needs - medical: Not on file  . Transportation needs - non-medical: Not on file  Occupational History  . Not on file  Tobacco Use  . Smoking status: Never Smoker  . Smokeless tobacco: Never Used  Substance and Sexual Activity  . Alcohol use: No    Frequency: Never  . Drug use: No  . Sexual activity: Not on file  Other Topics Concern  . Not on file  Social History Narrative  . Not on file    Medications:   No current outpatient medications  on file prior to visit.   No current facility-administered medications on file prior to visit.     Allergies:  No Known Allergies  Physical Exam General: Frail petite middle-aged [place] male, seated, in no evident distress Head: head normocephalic and atraumatic.  Neck: supple with no carotid or supraclavicular bruits Cardiovascular: regular rate and rhythm, no murmurs Musculoskeletal: no deformity but  right shoulder movements limited due to pain. Skin:  no rash/petichiae Vascular:  Normal pulses all extremities Vitals:   09/03/17 0858  BP: (!) 151/100  Pulse: 72   Neurologic Exam Mental Status: Awake and fully alert. Oriented to place and time. Recent and remote memory intact. Attention span, concentration and fund of knowledge appropriate. Mood and affect appropriate.  Cranial Nerves: Fundoscopic exam reveals sharp disc margins. Pupils equal, briskly reactive to light. Extraocular movements full without nystagmus. Visual fields show dense left homonymous any anopsia confrontation. Hearing intact. Facial sensation intact. Face, tongue, palate moves normally and symmetrically.  Motor: Right shoulder movements limited due to mechanical pain but good strength in rest of the muscles on the right side. Spastic left hemiparesis with 3/5 left approximately and 4/5 left lower extremity strength with significant weakness of left grip, intrinsic hand muscles, left hip flexors and left ankle dorsiflexors with left foot drop. Tone is increased on the left compared to the right.  Sensory.: intact to touch ,pinprick .position and vibratory sensation.  Coordination: Rapid alternating movements normal in all extremities. Finger-to-nose and heel-to-shin performed accurately bilaterally. Gait and Station: Arises from chair without difficulty. Stance is normal. Gait demonstrates normal stride length and balance . Able to heel, toe and tandem walk without difficulty.  Reflexes: 2+ and asymmetric and brisker on the left compared to the right. Toes downgoing.   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 Vascular risk factors of hypertension hyperlipidemia.    PLAN: I had a long d/w patient and his friend about his recent stroke, risk for recurrent  stroke/TIAs, personally independently reviewed imaging studies and stroke evaluation results and answered questions.Continue aspirin 325 mg daily and clopidogrel 75 mg daily  for secondary stroke prevention and maintain strict control of hypertension with blood pressure goal below 130/90, diabetes with hemoglobin A1c goal below 6.5% and lipids with LDL cholesterol goal below 70 mg/dL. I also advised the patient to eat a healthy diet with plenty of whole grains, cereals, fruits and vegetables, exercise regularly and maintain ideal body weight. Recommend regular follow-up with her primary care physician and since patient does not have one we'll refer him to the [facility]. Refer to outpatient physical and occupational therapy.   Followup in the future with my nurse practitioner Shanda Bumps in 3 months or call earlier if necessary. The patient was counseled to take all his medications. 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 Any transcriptional errors that result from this process are unintentional

## 2017-09-03 NOTE — Patient Instructions (Signed)
I had a long d/w patient about his recent stroke, risk for recurrent stroke/TIAs, personally independently reviewed imaging studies and stroke evaluation results and answered questions.Continue aspirin 325 mg daily and clopidogrel 75 mg daily  for secondary stroke prevention and maintain strict control of hypertension with blood pressure goal below 130/90, diabetes with hemoglobin A1c goal below 6.5% and lipids with LDL cholesterol goal below 70 mg/dL. I also advised the patient to eat a healthy diet with plenty of whole grains, cereals, fruits and vegetables, exercise regularly and maintain ideal body weight. Recommend regular follow-up with her primary care physician and since patient does not have one we'll [facility]. Refer to outpatient physical and occupational therapy.   Followup in the future with my nurse practitioner Shanda BumpsJessica in 3 months or call earlier if necessary

## 2017-09-03 NOTE — Telephone Encounter (Signed)
Pt. States they will CB to schedule 3 month follow-up w/ NP Jessica.

## 2017-09-04 ENCOUNTER — Telehealth: Payer: Self-pay | Admitting: Neurology

## 2017-09-04 NOTE — Telephone Encounter (Signed)
[facility] and [facility] . Niece Relayed to me to Hold off on order's for Now [place] moved to [place]. I asked Niece if she wanted be to set up there she relayed not at this time she would call me back .    Clabe SealRolan, Mio Niece   [phone]

## 2017-09-30 ENCOUNTER — Other Ambulatory Visit: Payer: Self-pay | Admitting: Adult Health

## 2017-09-30 DIAGNOSIS — G8194 Hemiplegia, unspecified affecting left nondominant side: Secondary | ICD-10-CM

## 2018-06-14 IMAGING — MR MR HEAD W/O CM
9 of 11 series · 31 of 48 positions shown · non-contrast
Comparison: CT HEAD June 27, 2017 at 1111 hours and CTA HEAD
June 27, 2017 at 4467 hours

CLINICAL DATA: Acute onset LEFT-sided weakness and speech
disturbance after nap yesterday. Follow-up code stroke. History of
hypertension.

EXAM:
MRI HEAD WITHOUT CONTRAST
MRA HEAD WITHOUT CONTRAST
TECHNIQUE: Multiplanar, multiecho pulse sequences of the brain and surrounding
structures were obtained without intravenous contrast. Angiographic
images of the head were obtained using MRA technique without
contrast.

[Series 3: DWI · axial · 3.0mm · 0.94mm/px · z∈[-24,+103]mm · 6 of 88 slices shown (1 of 2)]
[im 1/88]
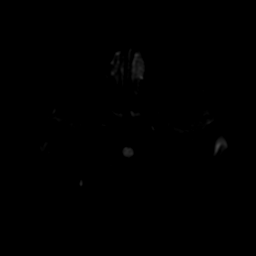
[im 18/88]
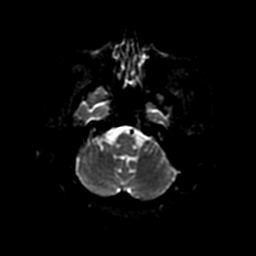
[im 35/88]
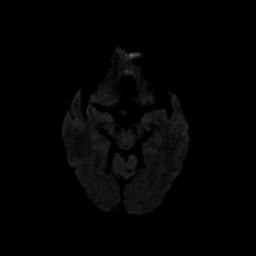
[im 53/88]
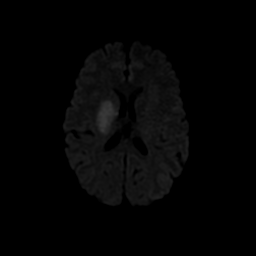
[im 70/88]
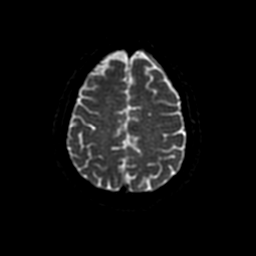
[im 88/88]
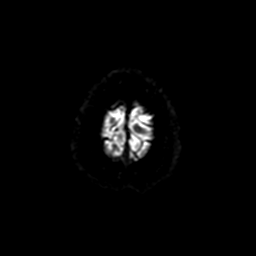

[Series 4: ax (id) 2 · axial · 1.0mm · 0.43mm/px · z∈[-41,+19]mm · 6 of 184 slices shown]
[im 1/184]
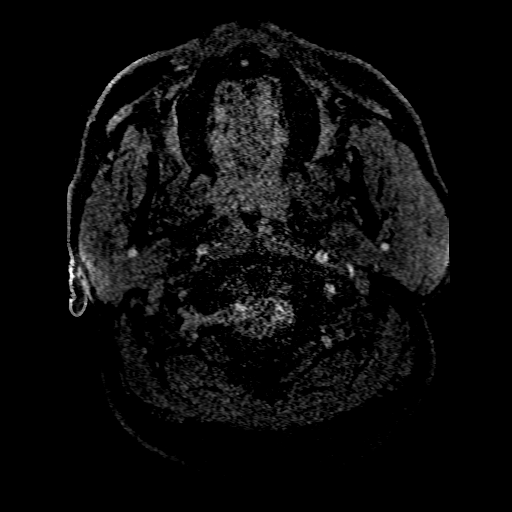
[im 31/184]
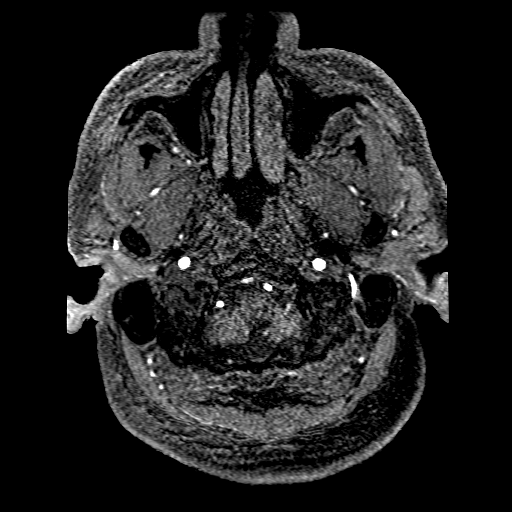
[im 62/184]
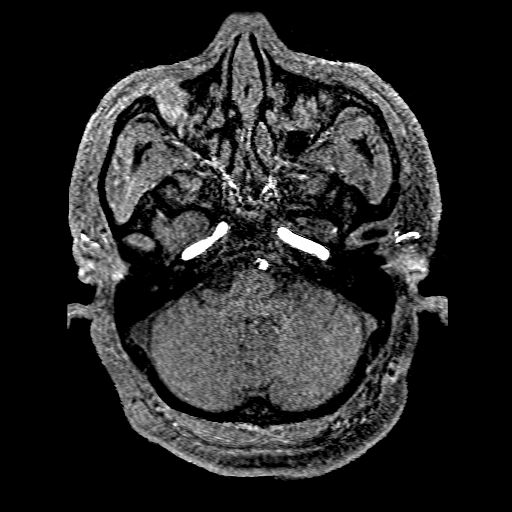
[im 77/184]
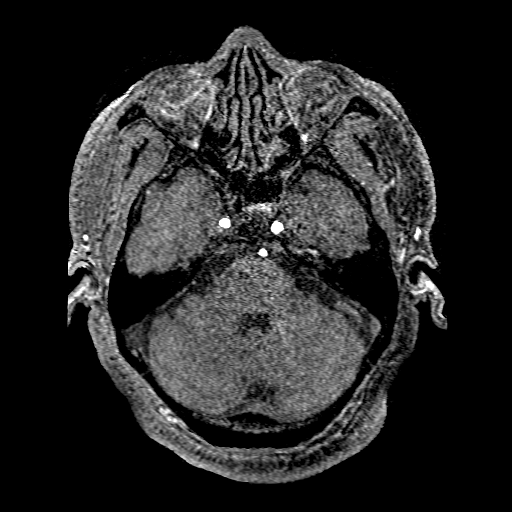
[im 107/184]
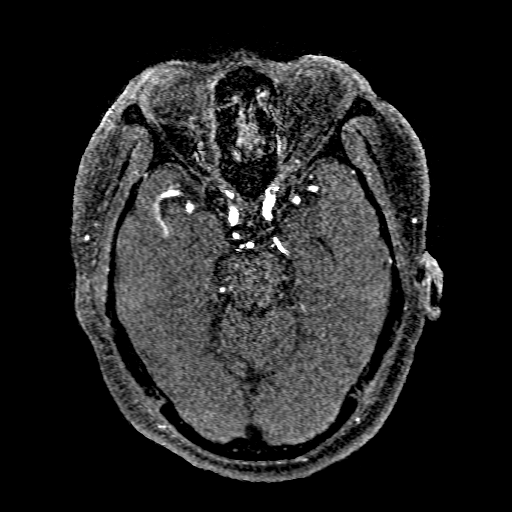
[im 123/184]
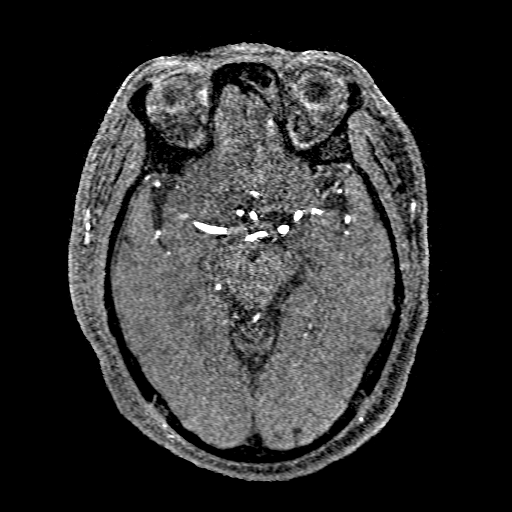

[Series 5: DWI · coronal · 4.0mm · 0.94mm/px · 5 of 67 slices shown (2 of 2)]
[im 1/67]
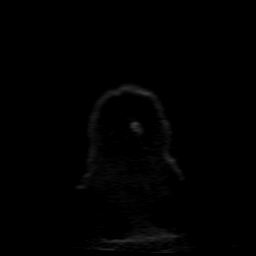
[im 17/67]
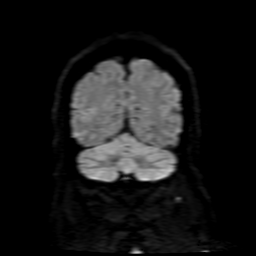
[im 34/67]
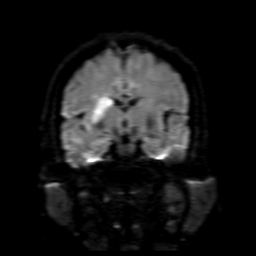
[im 50/67]
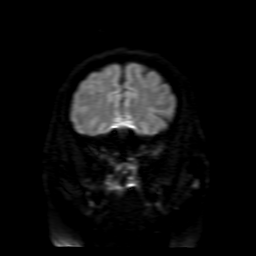
[im 67/67]
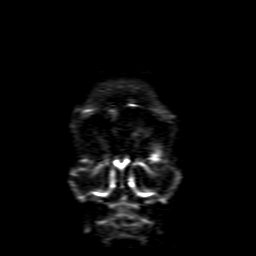

[Series 6: FLAIR · axial · 3.0mm · 0.94mm/px · z∈[-23,+101]mm · 2 of 22 slices shown (1 of 2)]
[im 1/22]
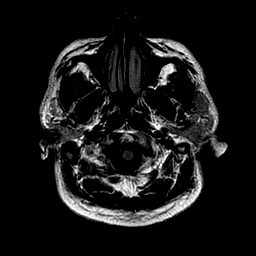
[im 22/22]
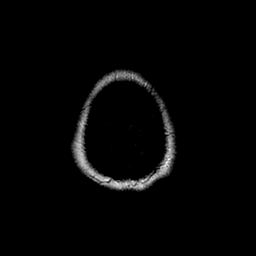

[Series 8: T2 · axial · 5.0mm · 0.47mm/px · z∈[-23,+101]mm · 2 of 22 slices shown (1 of 2)]
[im 1/22]
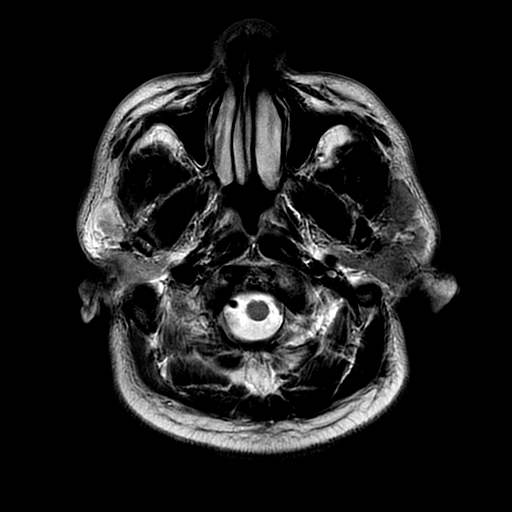
[im 22/22]
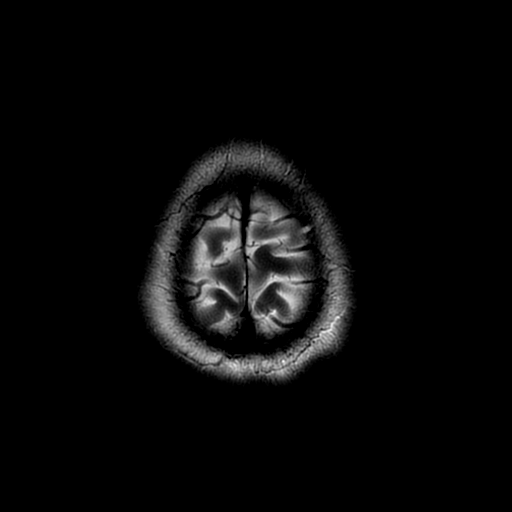

[Series 9: FLAIR · sagittal · 5.0mm · 0.47mm/px · 2 of 21 slices shown (2 of 2)]
[im 1/21]
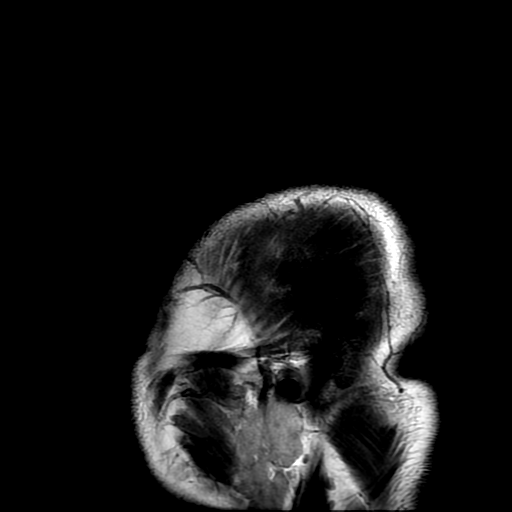
[im 21/21]
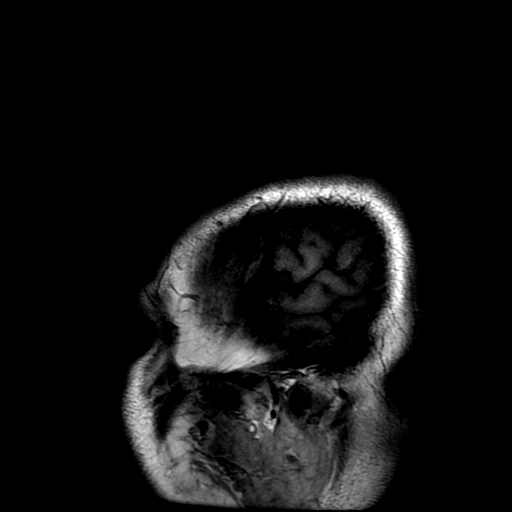

[Series 11: T2 · coronal · 5.0mm · 0.43mm/px · 2 of 28 slices shown (2 of 2)]
[im 1/28]
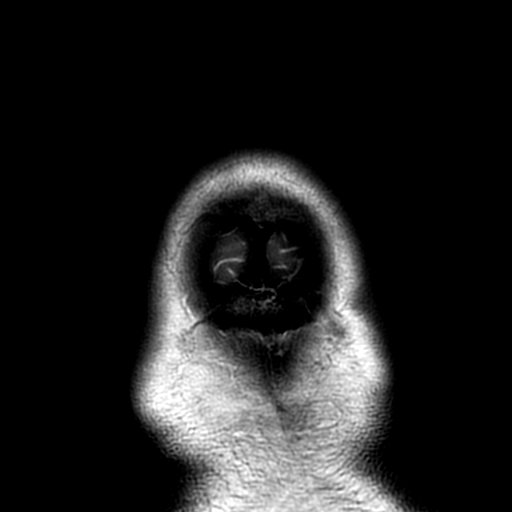
[im 28/28]
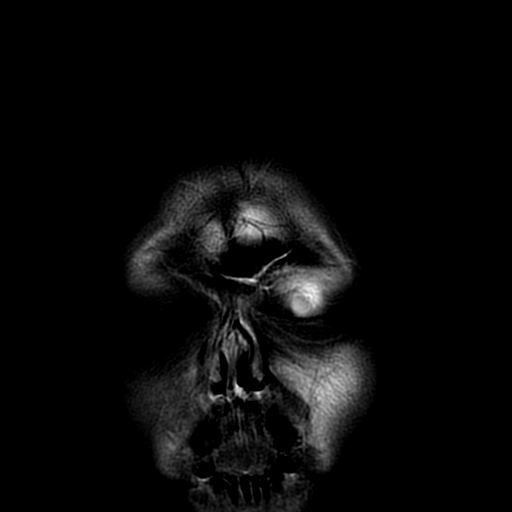

[Series 350: ADC · axial · 3.0mm · 0.94mm/px · z∈[-24,+103]mm · 3 of 44 slices shown (1 of 2)]
[im 1/44]
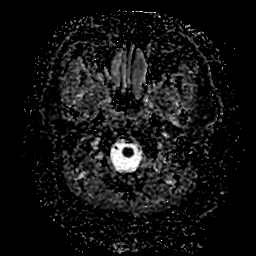
[im 22/44]
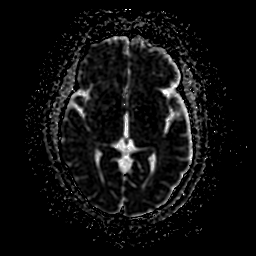
[im 44/44]
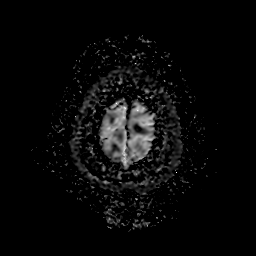

[Series 550: ADC · coronal · 4.0mm · 0.94mm/px · 3 of 34 slices shown (2 of 2)]
[im 1/34]
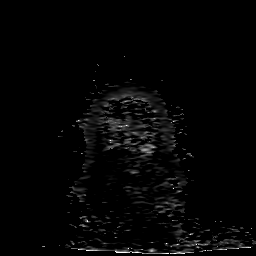
[im 17/34]
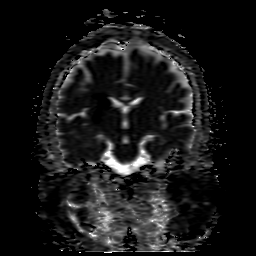
[im 34/34]
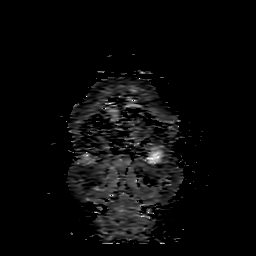

[31 of 48 positions shown; findings below may reference images not displayed]

FINDINGS: MRI HEAD FINDINGS

BRAIN: Confluent reduced diffusion RIGHT basal ganglia measuring to
4 x 1.7 cm with low ADC values. No susceptibility artifact to
suggest hemorrhage. RIGHT brachium pontis possible developmental
venous anomaly. Ventricles and sulci are normal for patient's age.
No midline shift, mass effect or masses. A few scattered
subcentimeter supratentorial white matter FLAIR T2 hyperintensities
compatible with mild chronic small vessel ischemic disease. No
abnormal extra-axial fluid collections.

VASCULAR: Normal major intracranial vascular flow voids present at
skull base.

SKULL AND UPPER CERVICAL SPINE: No abnormal sellar expansion. No
suspicious calvarial bone marrow signal. Craniocervical junction
maintained.

SINUSES/ORBITS: The mastoid air-cells and included paranasal sinuses
are well-aerated. The included ocular globes and orbital contents
are non-suspicious.

OTHER: None.

MRA HEAD FINDINGS- mildly motion degraded examination.

ANTERIOR CIRCULATION: Normal flow related enhancement of the
included cervical, petrous, cavernous and supraclinoid internal
carotid arteries. Patent anterior communicating artery. Proximal
severe flow limiting stenosis RIGHT M2 segment and thready distal
flow. Otherwise patent anterior and middle cerebral arteries,
including distal segments.

No large vessel occlusion, flow limiting stenosis, aneurysm.

POSTERIOR CIRCULATION: Codominant vertebral artery's. Basilar artery
is patent, with normal flow related enhancement of the main branch
vessels. Patent posterior cerebral arteries. Fetal origin RIGHT
posterior cerebral artery.

No large vessel occlusion, flow limiting stenosis,  aneurysm.

ANATOMIC VARIANTS: Aplastic RIGHT A1 segment.

Source images and MIP images were reviewed.
IMPRESSION: MRI HEAD:

1. Acute nonhemorrhagic RIGHT basal ganglia infarct.
2. Mild chronic small vessel ischemic disease.
MRA HEAD:

1. Focal high-grade flow limiting stenosis proximal RIGHT M2
segment. No emergent large vessel occlusion.
Acute findings discussed with and reconfirmed by Dr.[email or web address], Neurology
on 06/27/2017 at [DATE].

## 2018-06-14 IMAGING — CT CT HEAD CODE STROKE
3 series · 14 of 46 positions shown, 16 images · non-contrast
Comparison: None.

CLINICAL DATA: Code stroke. Left-sided weakness. Last seen normal
yesterday.

EXAM:
CT HEAD WITHOUT CONTRAST
TECHNIQUE: Contiguous axial images were obtained from the base of the skull
through the vertex without intravenous contrast.

[Series 3: head 5.0 st · axial · 0.45mm/px · z∈[-84,+36]mm · 8 of 29 slices shown, 10 images]
[im 3/29  brain]
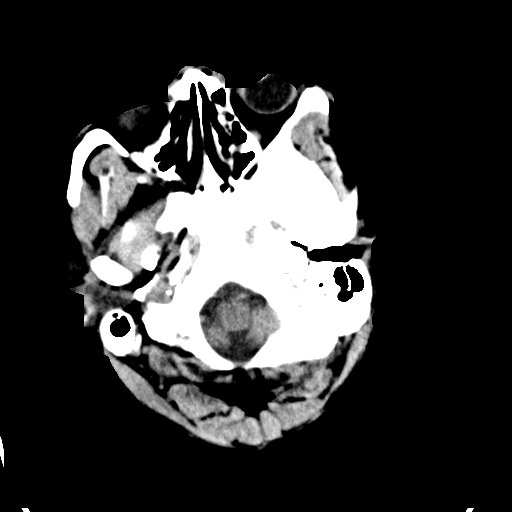
[im 3/29  bone]
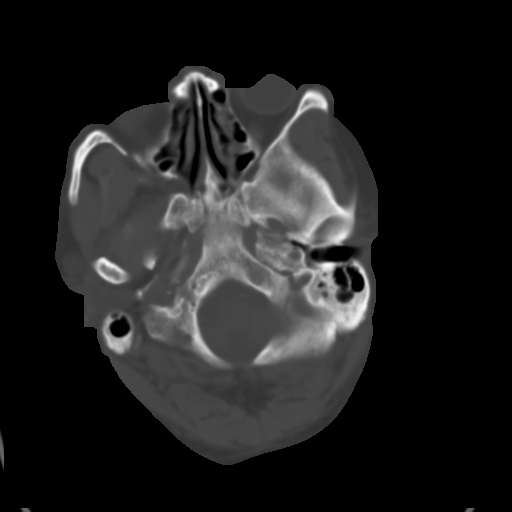
[im 7/29  brain]
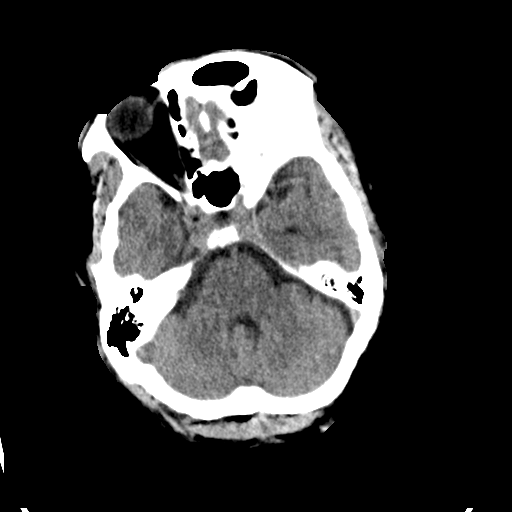
[im 10/29  brain]
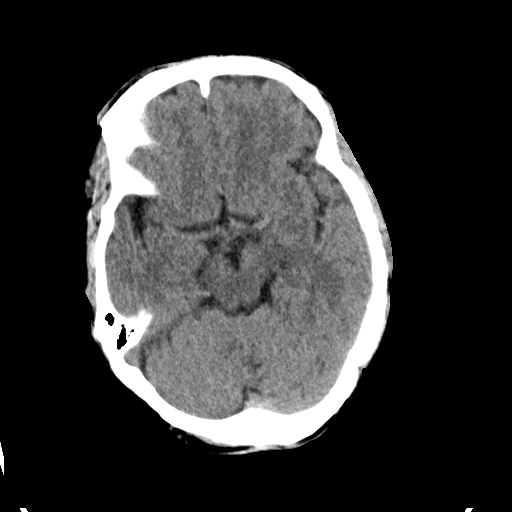
[im 13/29  brain]
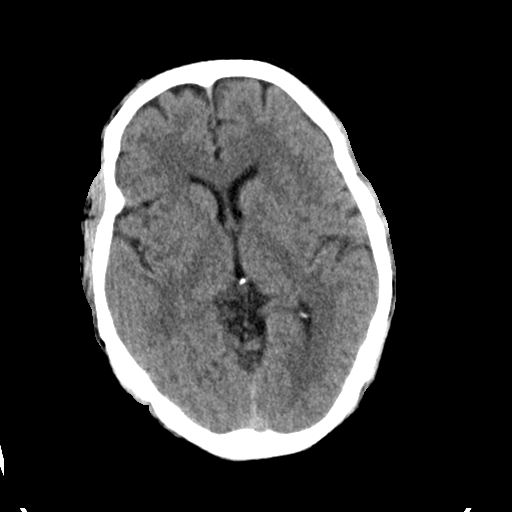
[im 17/29  brain]
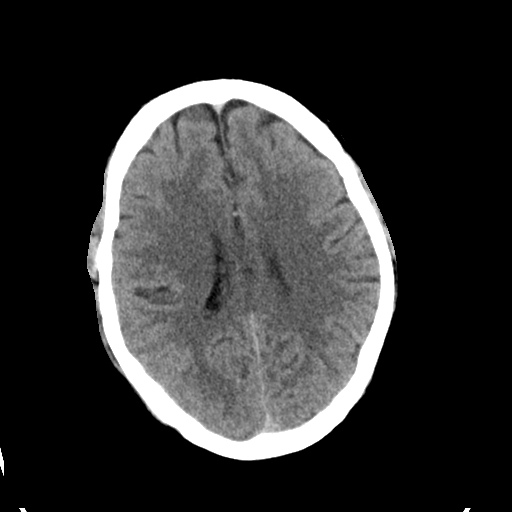
[im 17/29  bone]
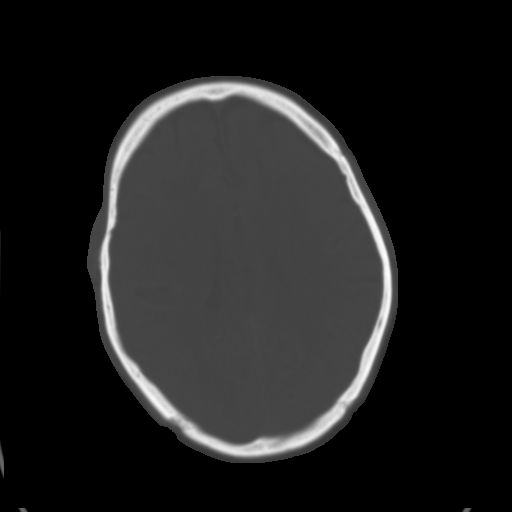
[im 20/29  brain]
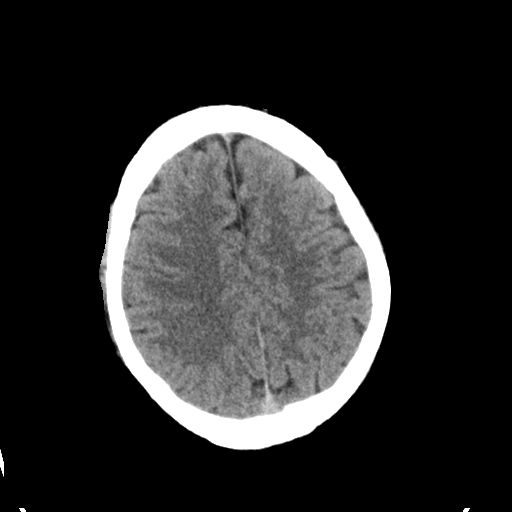
[im 23/29  brain]
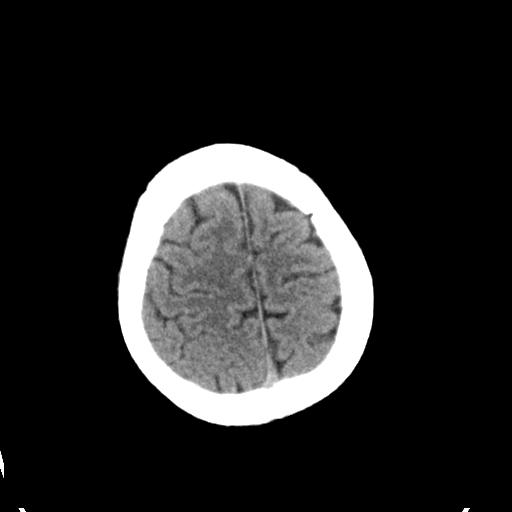
[im 27/29  brain]
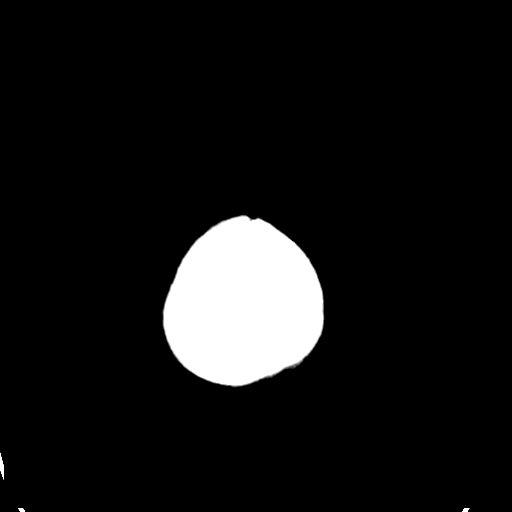

[Series 5: head 3.0 cor st · coronal · 0.28mm/px · 3 of 66 slices shown]
[im 22/66  brain]
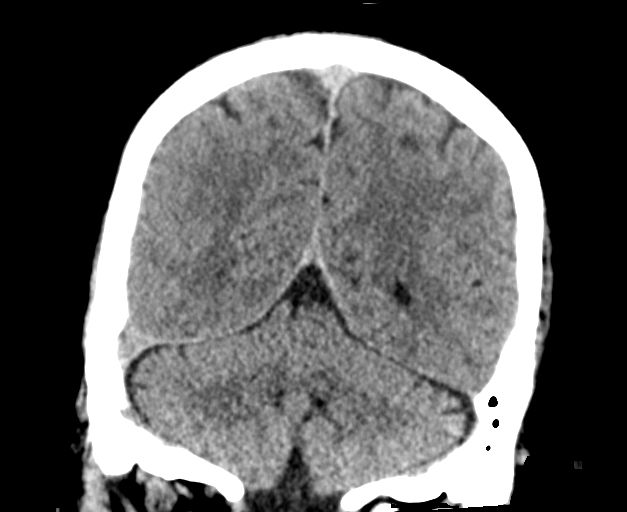
[im 29/66  brain]
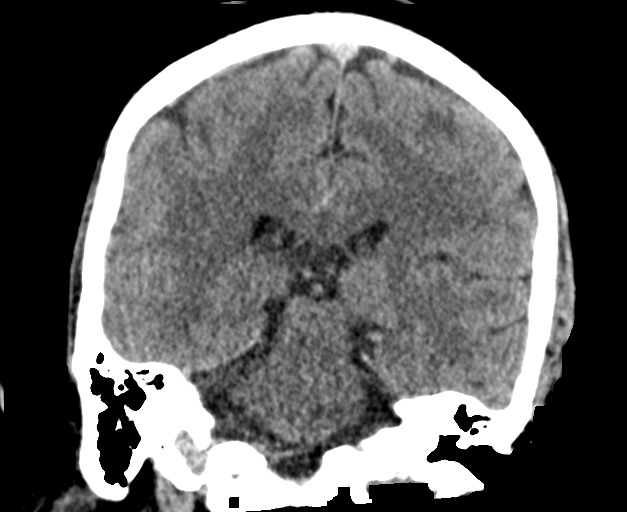
[im 37/66  brain]
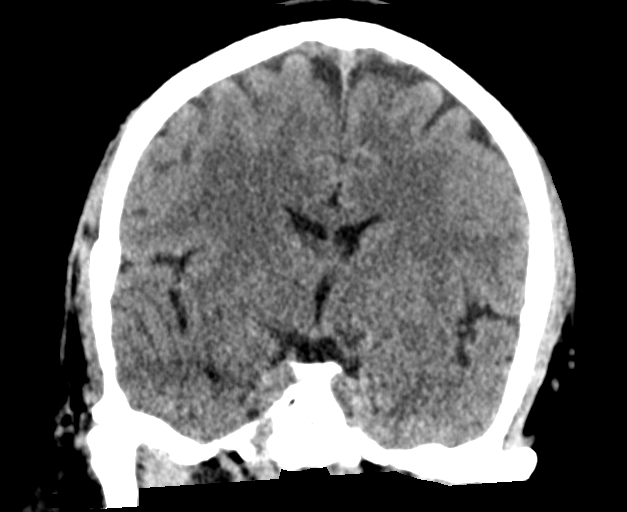

[Series 6: head 3.0 sag st · sagittal · 0.28mm/px · 3 of 60 slices shown]
[im 20/60  brain]
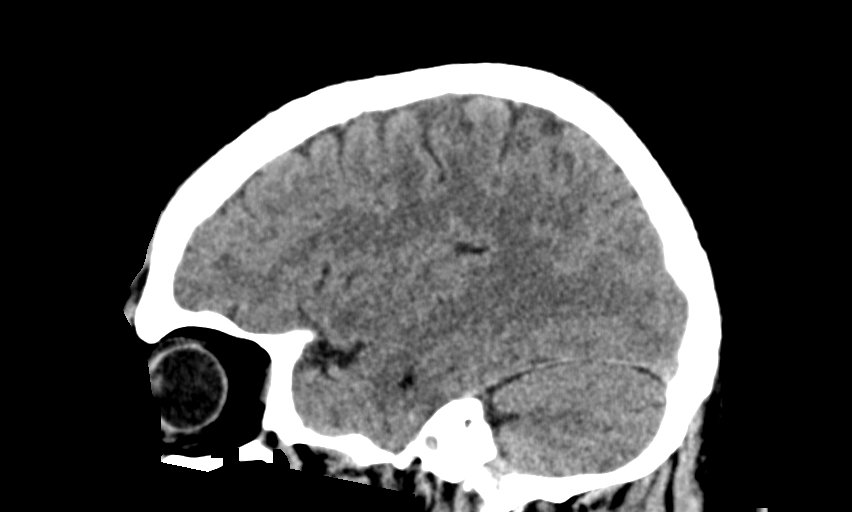
[im 30/60  brain]
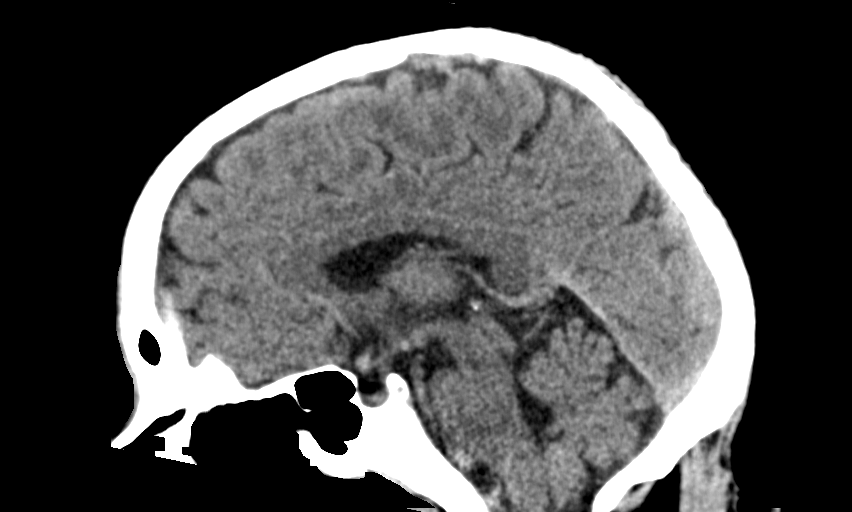
[im 40/60  brain]
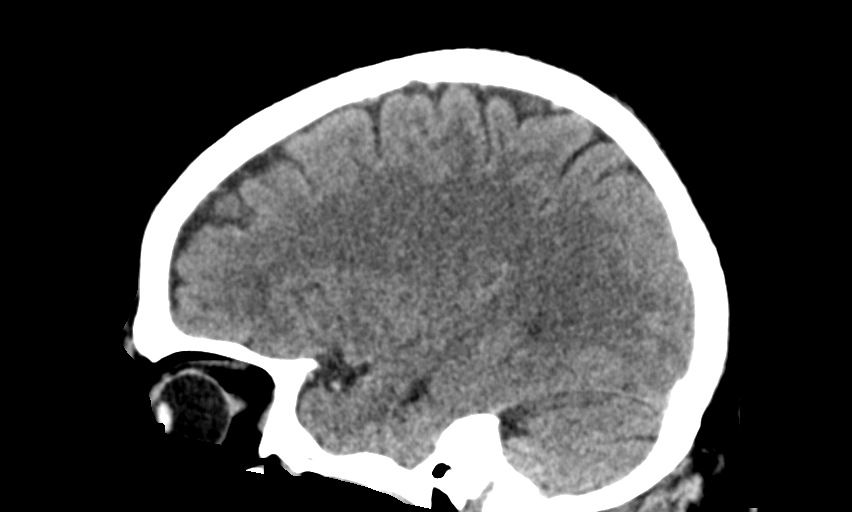

[14 of 46 positions shown; findings below may reference images not displayed]

FINDINGS: Brain: Normal appearance without evidence of malformation, atrophy,
old or acute infarction, mass lesion, hemorrhage, hydrocephalus or
extra-axial collection.

Vascular: No abnormal vascular finding.

Skull: Normal

Sinuses/Orbits: Clear/normal

Other: None

ASPECTS (Alberta Stroke Program Early CT Score)

- Ganglionic level infarction (caudate, lentiform nuclei, internal
capsule, insula, M1-M3 cortex): 7

- Supraganglionic infarction (M4-M6 cortex): 3

Total score (0-10 with 10 being normal): 10
IMPRESSION: 1. Normal head CT
2. ASPECTS is 10.

These results were communicated to Dr. Narendhar at [DATE] [employer]
06/27/2017by text page via the [employer] messaging system.

## 2018-06-14 IMAGING — DX DG CHEST 2V
2 series · 2 of 2 positions shown · non-contrast
Comparison: None.

CLINICAL DATA: 51-year-old male with a history of stroke

EXAM:
CHEST  2 VIEW

[chest lat]
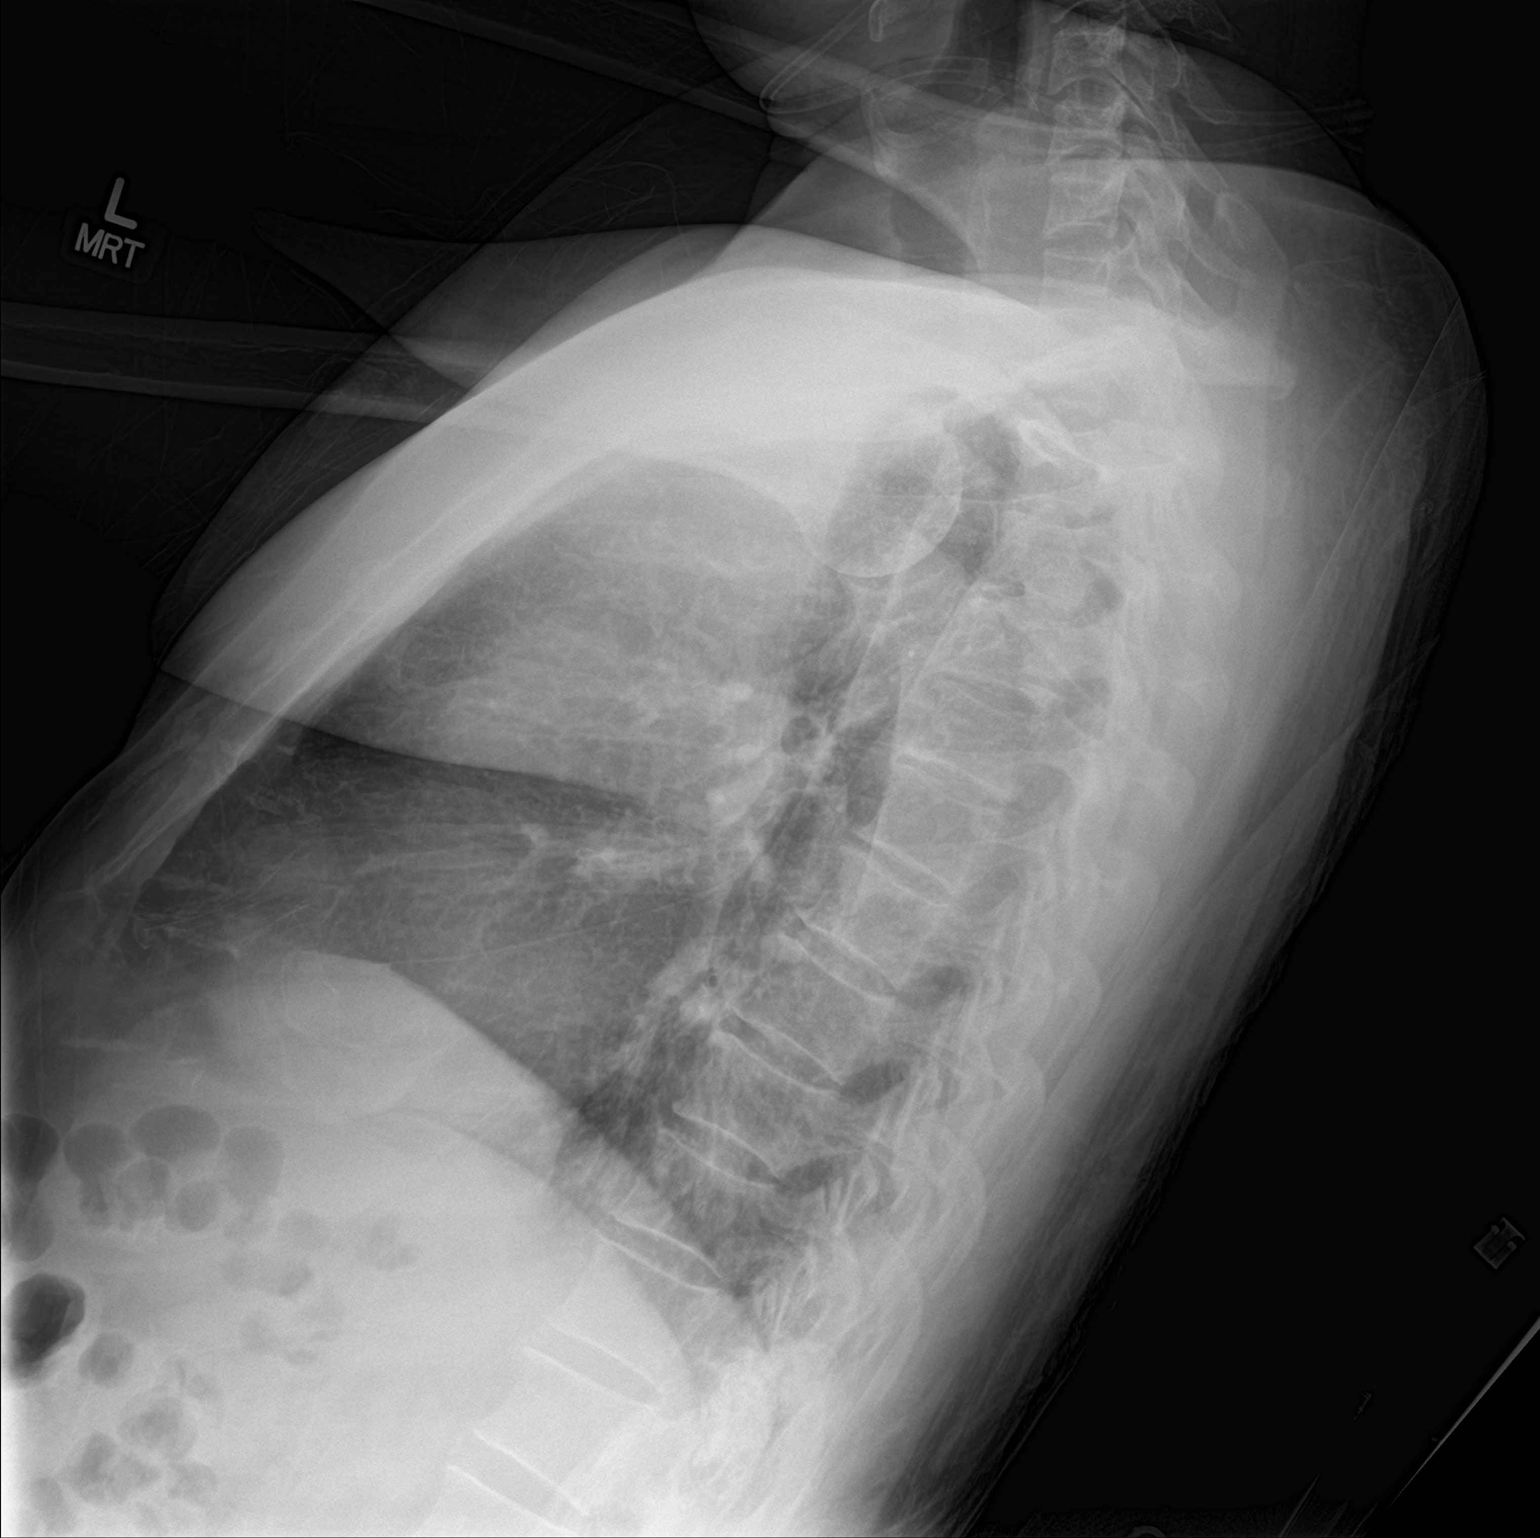

[chest ap]
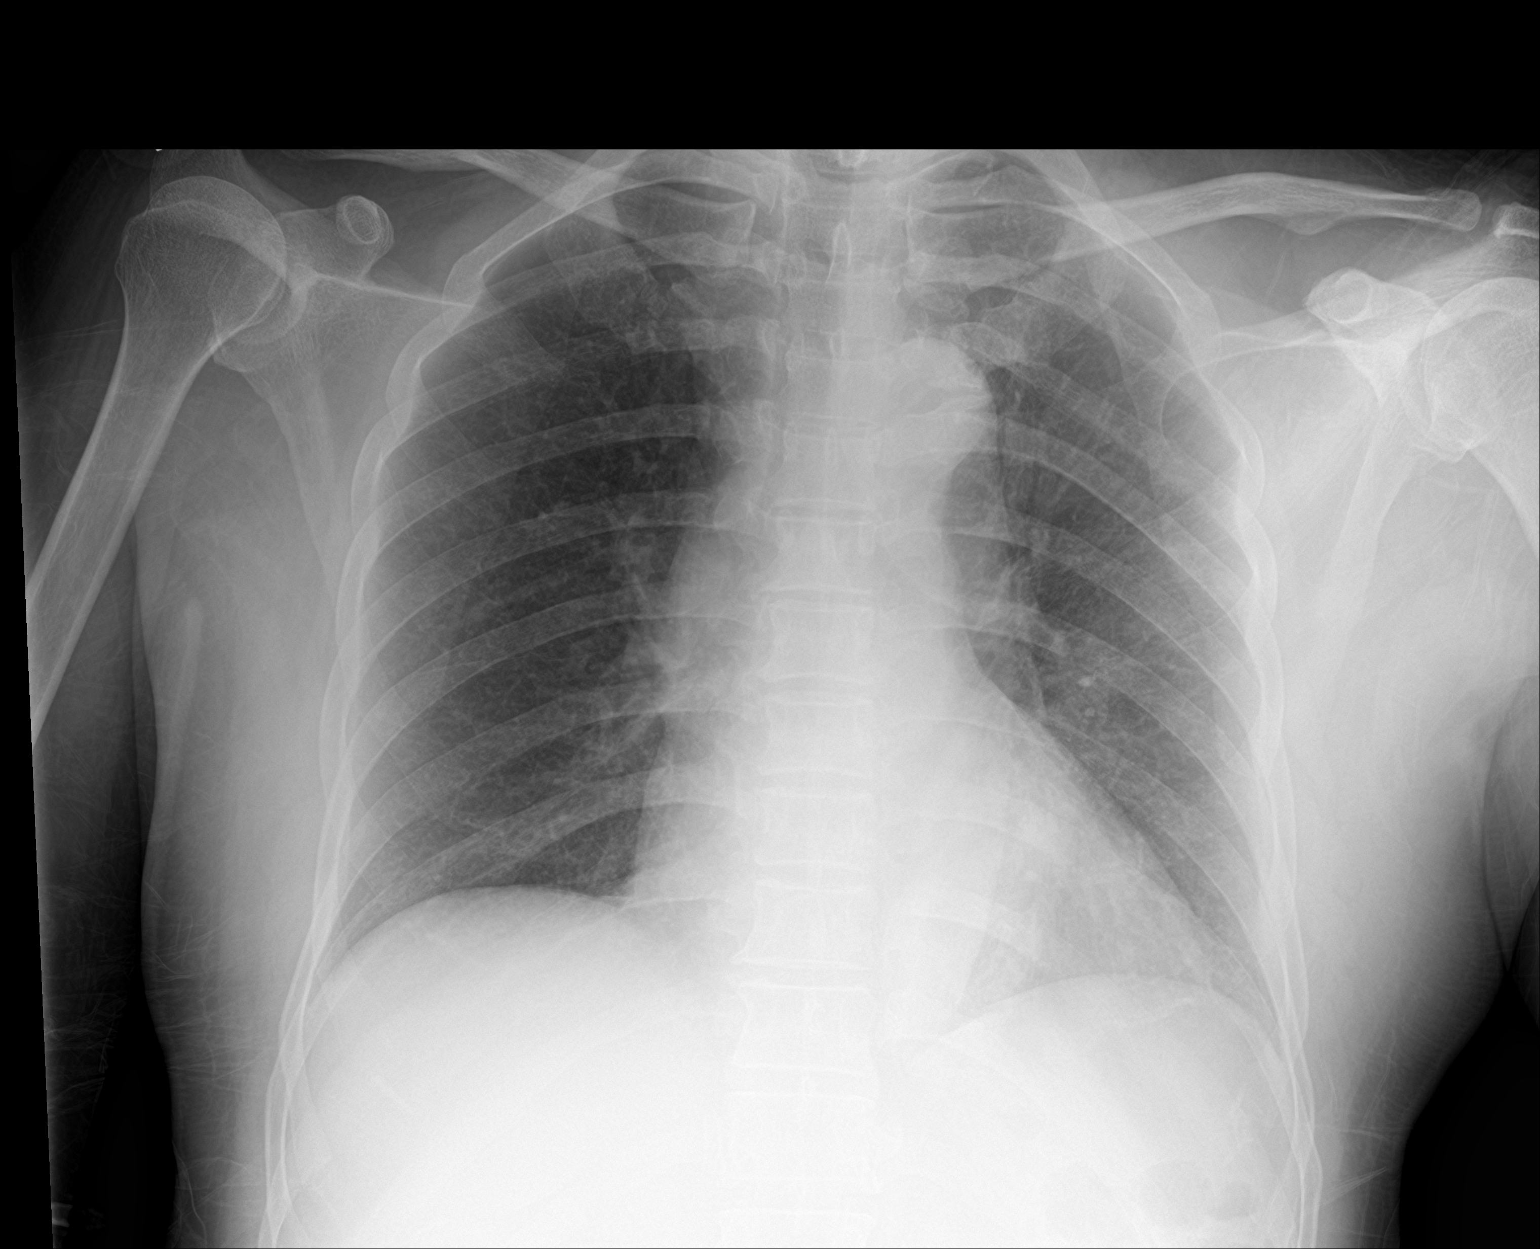

[2 of 2 positions shown; findings below may reference images not displayed]

FINDINGS: The heart size and mediastinal contours are within normal limits.
Both lungs are clear. The visualized skeletal structures are
unremarkable.
IMPRESSION: Negative for acute cardiopulmonary disease.

## 2018-06-16 IMAGING — MR MR SHOULDER*R* WO/W CM
5 of 9 series · 19 of 40 positions shown · IV contrast (multihance)
Comparison: None.

CLINICAL DATA: Patient admitted 06/27/2017 with left-sided weakness
and inability to stand. Right shoulder pain. Question septic joint.

EXAM:
MRI OF THE RIGHT SHOULDER WITHOUT AND WITH CONTRAST
TECHNIQUE: Multiplanar, multisequence MR imaging of the right shoulder was
performed before and after the administration of intravenous
contrast.
CONTRAST:  12 ml MULTIHANCE GADOBENATE DIMEGLUMINE 529 MG/ML IV SOLN

[Series 5: T2 fat-sat · axial · 3.0mm · 0.23mm/px · z∈[+8,+88]mm · 6 of 24 slices shown (1 of 3)]
[im 1/24]
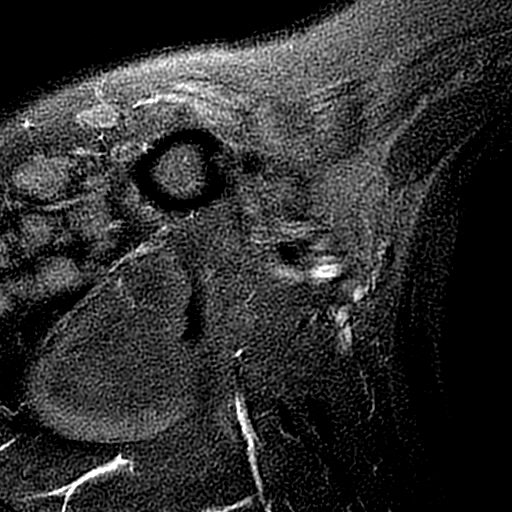
[im 5/24]
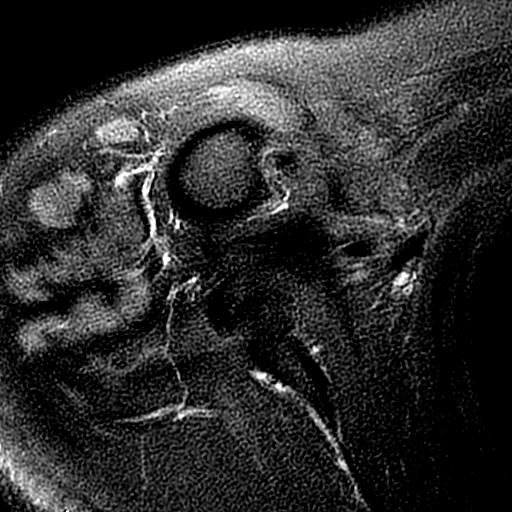
[im 10/24]
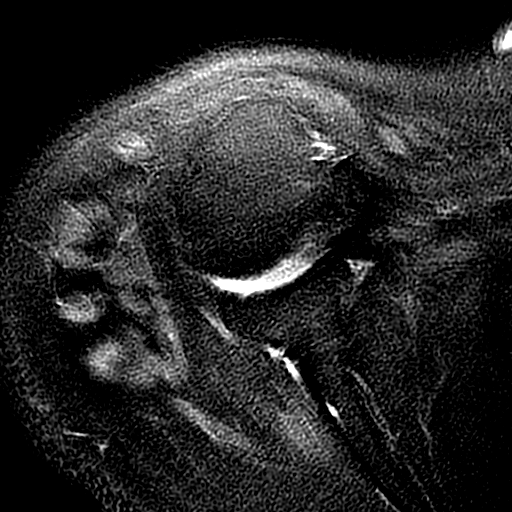
[im 14/24]
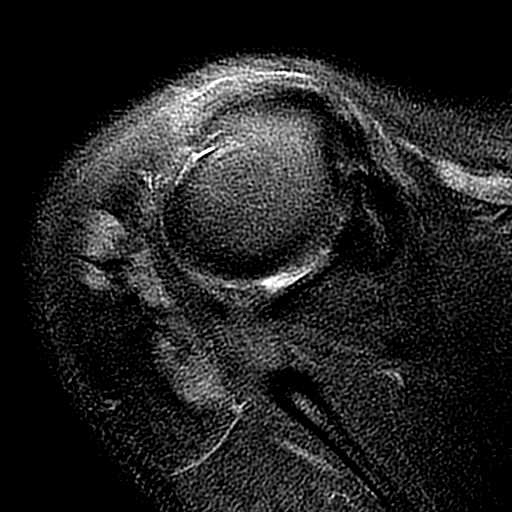
[im 19/24]
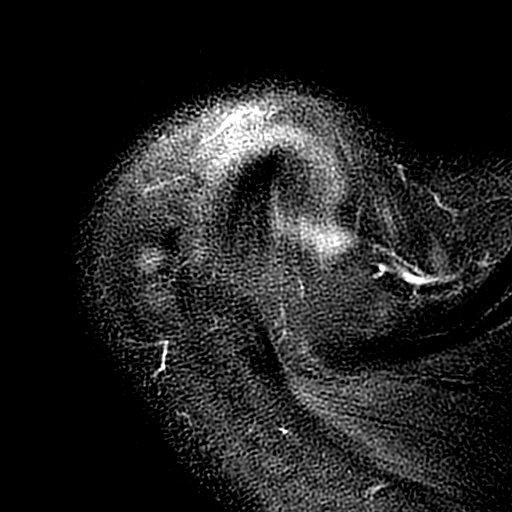
[im 24/24]
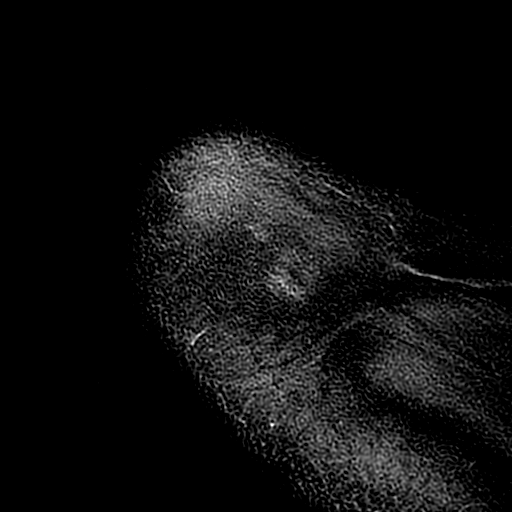

[Series 6: T2 fat-sat · sagittal · 3.0mm · 0.27mm/px · 4 of 20 slices shown (2 of 3)]
[im 1/20]
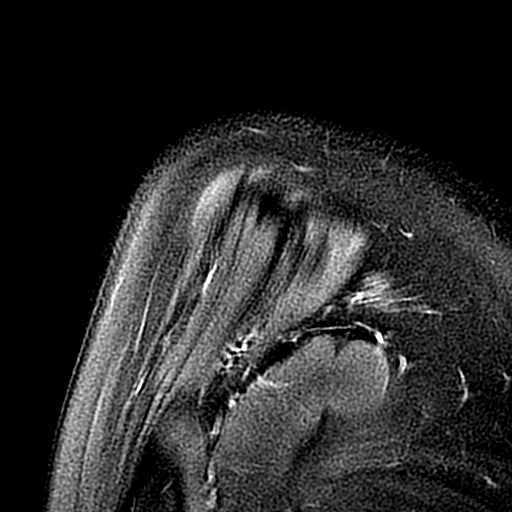
[im 7/20]
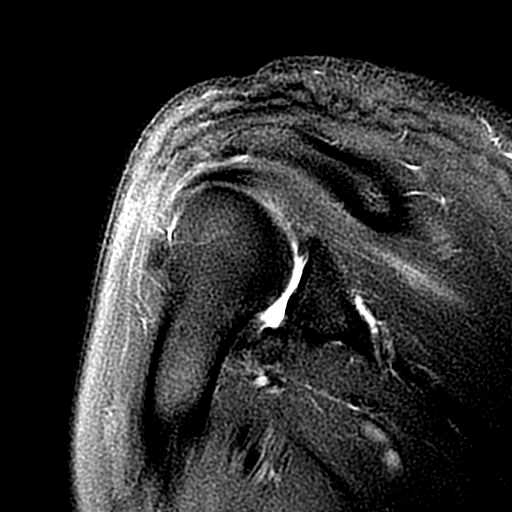
[im 13/20]
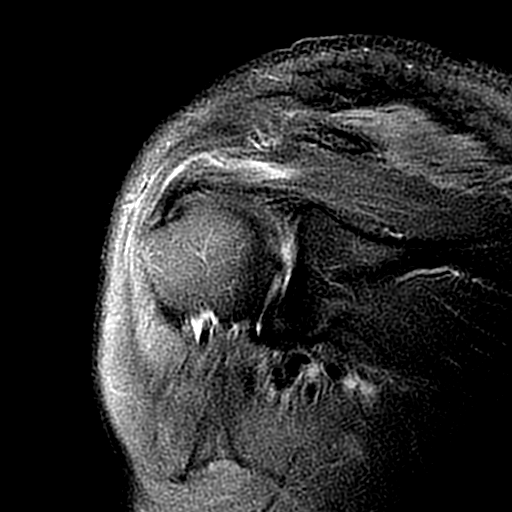
[im 20/20]
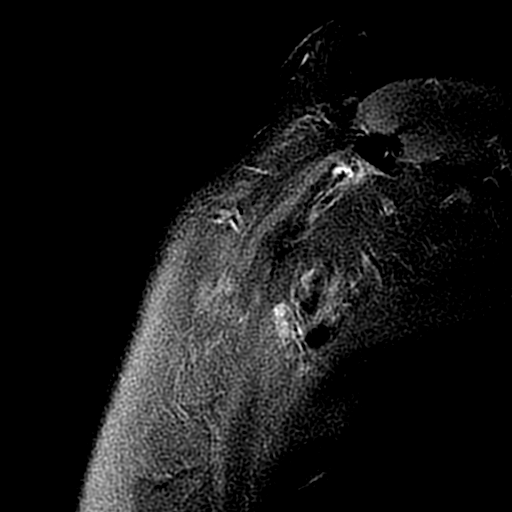

[Series 7: PD · sagittal · 3.0mm · 0.27mm/px · 4 of 20 slices shown]
[im 1/20]
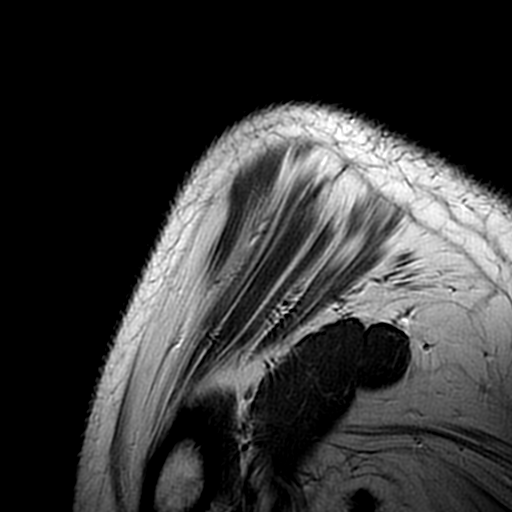
[im 7/20]
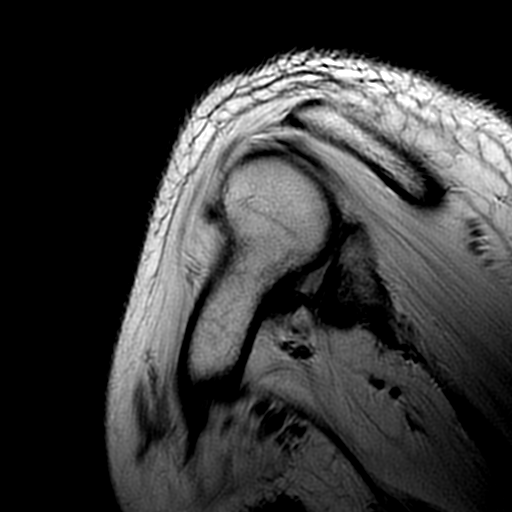
[im 13/20]
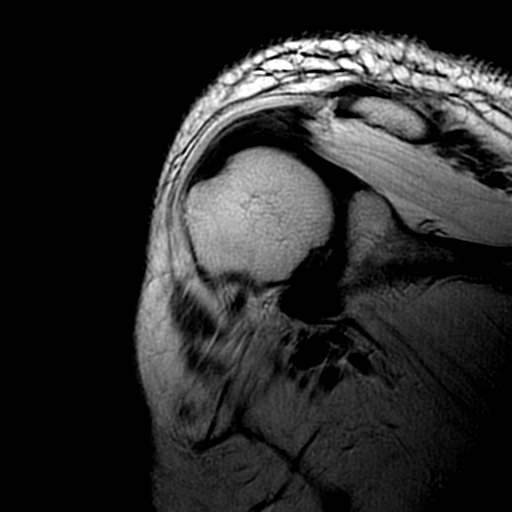
[im 20/20]
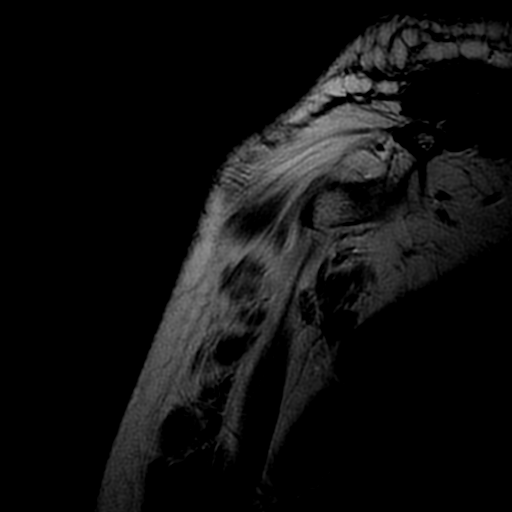

[Series 9: T2 fat-sat · coronal · 3.0mm · 0.27mm/px · 4 of 22 slices shown (3 of 3)]
[im 1/22]
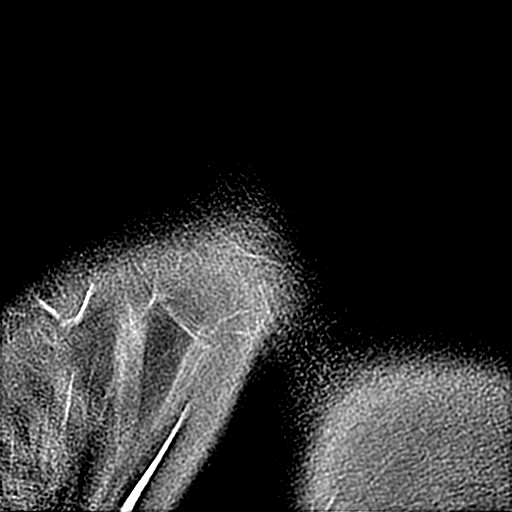
[im 8/22]
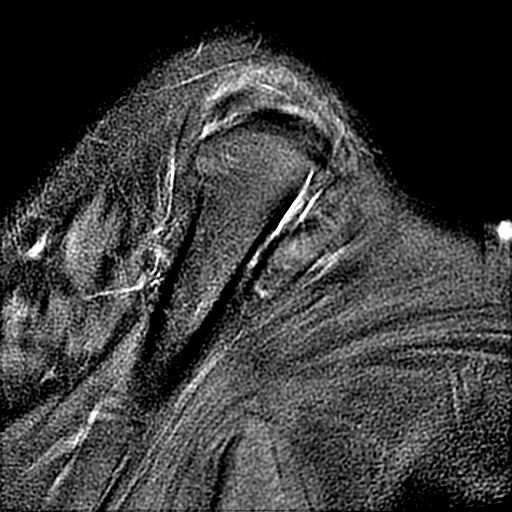
[im 15/22]
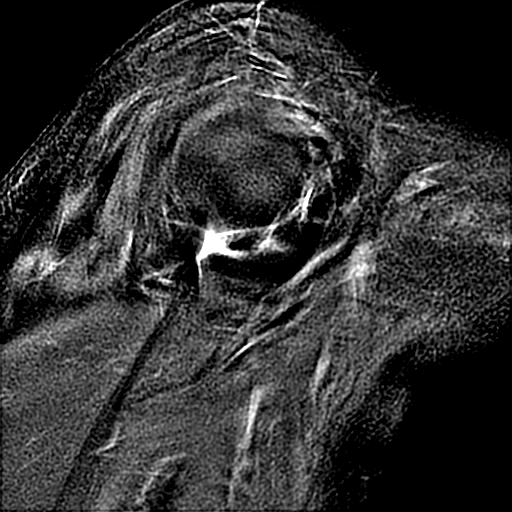
[im 22/22]
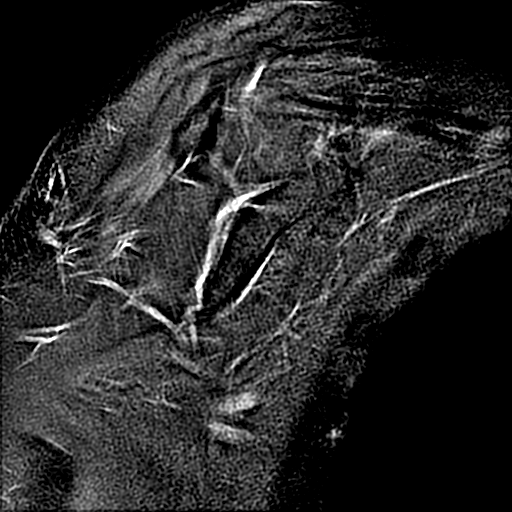

[Series 10: T1 fat-sat · axial · non-contrast · 3.0mm · 0.23mm/px · 1 of 24 slices shown]
[im 1/24]
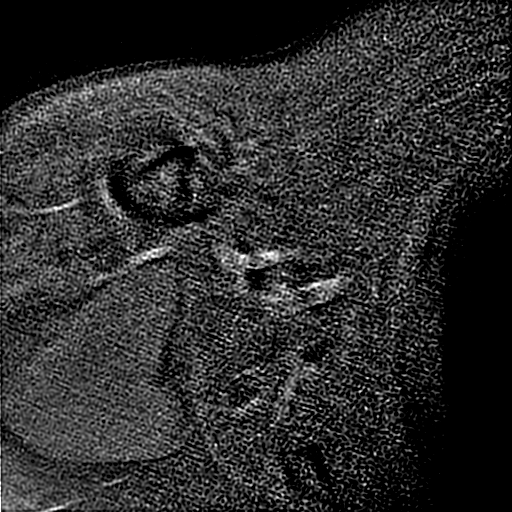

[19 of 40 positions shown; findings below may reference images not displayed]

FINDINGS: Rotator cuff:  Intact.

Muscles: There is severe fatty atrophy of all the rotator cuff
muscles. Moderately severe fatty atrophy of the deltoid is also
seen. Intermediate increased T2 signal is seen throughout the imaged
trapezius. The imaged triceps appears normal.

Biceps long head:  Intact.

Acromioclavicular Joint: Normal. Type 1 acromion. No
subacromial/subdeltoid bursal fluid.

Glenohumeral Joint: Appears normal.  No evidence of infection.

Labrum:  Evaluation is limited by motion but no tear is identified.

Bones: Normal marrow signal throughout. No evidence of fracture,
infection or focal lesion.

Other: None.
IMPRESSION: Complete fatty replacement of all the rotator cuff musculature
without tendon tear. Edema in the imaged trapezius is consistent
with early denervation atrophy change. Moderately severe atrophy of
the deltoid is also present. Cause for these findings is not
identified.

Negative for septic joint or other acute abnormality.
